# Patient Record
Sex: Female | Born: 1975
Health system: Southern US, Community
[De-identification: ages and names within clinical notes are randomized; demographics above are authoritative.]

## PROBLEM LIST (undated history)

## (undated) DIAGNOSIS — F4321 Adjustment disorder with depressed mood: Secondary | ICD-10-CM

## (undated) DIAGNOSIS — R519 Headache, unspecified: Secondary | ICD-10-CM

## (undated) DIAGNOSIS — R51 Headache: Secondary | ICD-10-CM

## (undated) DIAGNOSIS — E039 Hypothyroidism, unspecified: Secondary | ICD-10-CM

## (undated) DIAGNOSIS — D649 Anemia, unspecified: Secondary | ICD-10-CM

## (undated) DIAGNOSIS — E119 Type 2 diabetes mellitus without complications: Secondary | ICD-10-CM

## (undated) DIAGNOSIS — G5700 Lesion of sciatic nerve, unspecified lower limb: Secondary | ICD-10-CM

## (undated) DIAGNOSIS — F419 Anxiety disorder, unspecified: Secondary | ICD-10-CM

## (undated) DIAGNOSIS — E559 Vitamin D deficiency, unspecified: Secondary | ICD-10-CM

## (undated) DIAGNOSIS — E539 Vitamin B deficiency, unspecified: Secondary | ICD-10-CM

## (undated) DIAGNOSIS — K219 Gastro-esophageal reflux disease without esophagitis: Secondary | ICD-10-CM

## (undated) DIAGNOSIS — G4700 Insomnia, unspecified: Secondary | ICD-10-CM

## (undated) HISTORY — DX: Vitamin D deficiency, unspecified: E55.9

## (undated) HISTORY — PX: TOTAL ABDOMINAL HYSTERECTOMY: SHX209

## (undated) HISTORY — DX: Headache: R51

## (undated) HISTORY — DX: Anemia, unspecified: D64.9

## (undated) HISTORY — DX: Adjustment disorder with depressed mood: F43.21

## (undated) HISTORY — DX: Type 2 diabetes mellitus without complications: E11.9

## (undated) HISTORY — DX: Lesion of sciatic nerve, unspecified lower limb: G57.00

## (undated) HISTORY — DX: Headache, unspecified: R51.9

## (undated) HISTORY — DX: Vitamin B deficiency, unspecified: E53.9

## (undated) HISTORY — DX: Insomnia, unspecified: G47.00

## (undated) HISTORY — DX: Anxiety disorder, unspecified: F41.9

---

## 1997-11-09 ENCOUNTER — Other Ambulatory Visit: Admission: RE | Admit: 1997-11-09 | Discharge: 1997-11-09 | Payer: Self-pay | Admitting: Obstetrics & Gynecology

## 1999-12-11 ENCOUNTER — Other Ambulatory Visit: Admission: RE | Admit: 1999-12-11 | Discharge: 1999-12-11 | Payer: Self-pay | Admitting: Obstetrics & Gynecology

## 2000-11-25 ENCOUNTER — Other Ambulatory Visit: Admission: RE | Admit: 2000-11-25 | Discharge: 2000-11-25 | Payer: Self-pay | Admitting: Obstetrics and Gynecology

## 2002-01-27 ENCOUNTER — Other Ambulatory Visit: Admission: RE | Admit: 2002-01-27 | Discharge: 2002-01-27 | Payer: Self-pay | Admitting: Obstetrics and Gynecology

## 2002-08-09 ENCOUNTER — Encounter: Payer: Self-pay | Admitting: *Deleted

## 2002-08-09 ENCOUNTER — Ambulatory Visit (HOSPITAL_COMMUNITY): Admission: RE | Admit: 2002-08-09 | Discharge: 2002-08-09 | Payer: Self-pay | Admitting: *Deleted

## 2003-01-10 ENCOUNTER — Encounter: Payer: Self-pay | Admitting: Gastroenterology

## 2003-01-10 ENCOUNTER — Encounter: Admission: RE | Admit: 2003-01-10 | Discharge: 2003-01-10 | Payer: Self-pay | Admitting: Gastroenterology

## 2003-04-14 ENCOUNTER — Other Ambulatory Visit: Admission: RE | Admit: 2003-04-14 | Discharge: 2003-04-14 | Payer: Self-pay | Admitting: Obstetrics and Gynecology

## 2003-04-19 ENCOUNTER — Encounter: Payer: Self-pay | Admitting: Obstetrics and Gynecology

## 2003-04-19 ENCOUNTER — Ambulatory Visit (HOSPITAL_COMMUNITY): Admission: RE | Admit: 2003-04-19 | Discharge: 2003-04-19 | Payer: Self-pay | Admitting: Obstetrics and Gynecology

## 2003-11-28 ENCOUNTER — Ambulatory Visit (HOSPITAL_COMMUNITY): Admission: RE | Admit: 2003-11-28 | Discharge: 2003-11-28 | Payer: Self-pay | Admitting: Gastroenterology

## 2004-05-31 ENCOUNTER — Encounter: Admission: RE | Admit: 2004-05-31 | Discharge: 2004-05-31 | Payer: Self-pay | Admitting: Gastroenterology

## 2004-09-14 ENCOUNTER — Emergency Department (HOSPITAL_COMMUNITY): Admission: EM | Admit: 2004-09-14 | Discharge: 2004-09-14 | Payer: Self-pay | Admitting: Emergency Medicine

## 2005-01-17 ENCOUNTER — Other Ambulatory Visit: Admission: RE | Admit: 2005-01-17 | Discharge: 2005-01-17 | Payer: Self-pay | Admitting: Obstetrics and Gynecology
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
## 2006-02-24 ENCOUNTER — Other Ambulatory Visit: Admission: RE | Admit: 2006-02-24 | Discharge: 2006-02-24 | Payer: Self-pay | Admitting: Obstetrics and Gynecology

## 2006-12-01 ENCOUNTER — Other Ambulatory Visit: Admission: RE | Admit: 2006-12-01 | Discharge: 2006-12-01 | Payer: Self-pay | Admitting: Obstetrics & Gynecology

## 2007-04-29 ENCOUNTER — Other Ambulatory Visit: Admission: RE | Admit: 2007-04-29 | Discharge: 2007-04-29 | Payer: Self-pay | Admitting: Obstetrics and Gynecology

## 2008-05-29 ENCOUNTER — Other Ambulatory Visit: Admission: RE | Admit: 2008-05-29 | Discharge: 2008-05-29 | Payer: Self-pay | Admitting: Obstetrics and Gynecology

## 2010-02-10 ENCOUNTER — Emergency Department (HOSPITAL_COMMUNITY): Admission: EM | Admit: 2010-02-10 | Discharge: 2010-02-10 | Payer: Self-pay | Admitting: Emergency Medicine

## 2010-02-12 ENCOUNTER — Encounter: Admission: RE | Admit: 2010-02-12 | Discharge: 2010-02-12 | Payer: Self-pay | Admitting: *Deleted

## 2010-08-05 ENCOUNTER — Encounter
Admission: RE | Admit: 2010-08-05 | Discharge: 2010-08-05 | Payer: Self-pay | Source: Home / Self Care | Attending: Obstetrics and Gynecology | Admitting: Obstetrics and Gynecology

## 2011-04-07 ENCOUNTER — Ambulatory Visit (INDEPENDENT_AMBULATORY_CARE_PROVIDER_SITE_OTHER): Payer: 59 | Admitting: Family Medicine

## 2011-04-07 ENCOUNTER — Encounter: Payer: Self-pay | Admitting: Family Medicine

## 2011-04-07 VITALS — BP 118/70 | HR 64 | Wt 192.0 lb

## 2011-04-07 DIAGNOSIS — N760 Acute vaginitis: Secondary | ICD-10-CM

## 2011-04-07 NOTE — Patient Instructions (Signed)
Followup with your gynecologist with your gynecologist.

## 2011-04-07 NOTE — Progress Notes (Signed)
  Subjective:    Patient ID: Kimberly Atkinson, female    DOB: [birth year], 35 y.o.   MRN: [id number]  HPI She has a 4 year history of intermittent vaginal itching, discharge and odor. She has seen at least 2 physicians concerning this over the last several years. She has tried antifungal medicines as well as metronidazole. She was recently seen by her gynecologist and given a new medication and states she is again having symptoms after finishing the medicine approximately one week ago.   Review of Systems     Objective:   Physical Exam Alert and in no distress. Vaginal exam shows the tissue did appear normal. There was a slight discharge however KOH and wet prep were negative except for some bacteria to       Assessment & Plan:  Vaginitis I discussed the causes of this in regard to other medical conditions that might contribute to it. We discussed diet, other medical conditions including diabetes. Encouraged her to followup with the gynecologist and turn here for other medical concerns.

## 2011-08-13 ENCOUNTER — Encounter (HOSPITAL_COMMUNITY): Payer: Self-pay

## 2011-08-18 MED ORDER — ONDANSETRON HCL 4 MG/2ML IJ SOLN
INTRAMUSCULAR | Status: AC
  Filled 2011-08-18: qty 2

## 2011-08-18 MED ORDER — EPINEPHRINE HCL 0.1 MG/ML IJ SOLN
INTRAMUSCULAR | Status: AC
  Filled 2011-08-18: qty 10

## 2011-08-18 MED ORDER — EPHEDRINE 5 MG/ML INJ
INTRAVENOUS | Status: AC
  Filled 2011-08-18: qty 10

## 2011-08-18 MED ORDER — GLYCOPYRROLATE 0.2 MG/ML IJ SOLN
INTRAMUSCULAR | Status: AC
  Filled 2011-08-18: qty 1

## 2011-08-18 MED ORDER — FENTANYL CITRATE 0.05 MG/ML IJ SOLN
INTRAMUSCULAR | Status: AC
  Filled 2011-08-18: qty 2

## 2011-08-18 MED ORDER — PHENYLEPHRINE 40 MCG/ML (10ML) SYRINGE FOR IV PUSH (FOR BLOOD PRESSURE SUPPORT)
PREFILLED_SYRINGE | INTRAVENOUS | Status: AC
  Filled 2011-08-18: qty 10

## 2011-08-18 MED ORDER — MORPHINE SULFATE 0.5 MG/ML IJ SOLN
INTRAMUSCULAR | Status: AC
  Filled 2011-08-18: qty 10

## 2011-08-18 MED ORDER — OXYTOCIN 10 UNIT/ML IJ SOLN
INTRAMUSCULAR | Status: AC
  Filled 2011-08-18: qty 4

## 2011-08-18 MED ORDER — PHENYLEPHRINE 40 MCG/ML (10ML) SYRINGE FOR IV PUSH (FOR BLOOD PRESSURE SUPPORT)
PREFILLED_SYRINGE | INTRAVENOUS | Status: AC
  Filled 2011-08-18: qty 5

## 2011-08-18 MED ORDER — DIPHENHYDRAMINE HCL 50 MG/ML IJ SOLN
INTRAMUSCULAR | Status: AC
  Filled 2011-08-18: qty 1

## 2011-08-19 ENCOUNTER — Encounter (HOSPITAL_COMMUNITY)
Admission: RE | Admit: 2011-08-19 | Discharge: 2011-08-19 | Disposition: A | Discharge status: Home / Self Care | Payer: 59 | Source: Ambulatory Visit | Attending: Obstetrics and Gynecology | Admitting: Obstetrics and Gynecology

## 2011-08-19 ENCOUNTER — Encounter (HOSPITAL_COMMUNITY): Payer: Self-pay

## 2011-08-19 ENCOUNTER — Other Ambulatory Visit: Payer: Self-pay | Admitting: Obstetrics and Gynecology

## 2011-08-19 HISTORY — DX: Hypothyroidism, unspecified: E03.9

## 2011-08-19 HISTORY — DX: Gastro-esophageal reflux disease without esophagitis: K21.9

## 2011-08-19 LAB — CBC
HCT: 35.9 % — ABNORMAL LOW (ref 36.0–46.0)
MCH: 25.4 pg — ABNORMAL LOW (ref 26.0–34.0)
RBC: 4.45 MIL/uL (ref 3.87–5.11)
RDW: 13.5 % (ref 11.5–15.5)
WBC: 7.9 10*3/uL (ref 4.0–10.5)

## 2011-08-19 LAB — SURGICAL PCR SCREEN: MRSA, PCR: NEGATIVE

## 2011-08-19 NOTE — Patient Instructions (Addendum)
21 Kimberly Atkinson  08/19/2011   Your procedure is scheduled on:  08/28/11  Enter through the Main Entrance of [facility] at 6 AM.  Pick up the phone at the desk and dial [phone].   Call this number if you have problems the morning of surgery: [phone]   Remember:   Do not eat food:After Midnight.  Do not drink clear liquids: After Midnight.  Take these medicines the morning of surgery with A SIP OF WATER: Synthroid as early as possible day of surg with small amount of water.   Do not wear jewelry, make-up or nail polish.  Do not wear lotions, powders, or perfumes. You may wear deodorant.  Do not shave 48 hours prior to surgery.  Do not bring valuables to the hospital.  Contacts, dentures or bridgework may not be worn into surgery.  Leave suitcase in the car. After surgery it may be brought to your room.  For patients admitted to the hospital, checkout time is 11:00 AM the day of discharge.   Patients discharged the day of surgery will not be allowed to drive home.  Name and phone number of your driver: mother   [name]  Special Instructions: CHG Shower Use Special Wash: 1/2 bottle night before surgery and 1/2 bottle morning of surgery.   Please read over the following fact sheets that you were given: MRSA Information

## 2011-08-28 ENCOUNTER — Encounter (HOSPITAL_COMMUNITY): Payer: Self-pay | Admitting: Anesthesiology

## 2011-08-28 ENCOUNTER — Other Ambulatory Visit: Payer: Self-pay | Admitting: Obstetrics and Gynecology

## 2011-08-28 ENCOUNTER — Ambulatory Visit (HOSPITAL_COMMUNITY): Payer: 59 | Admitting: Anesthesiology

## 2011-08-28 ENCOUNTER — Ambulatory Visit (HOSPITAL_COMMUNITY)
Admission: RE | Admit: 2011-08-28 | Discharge: 2011-08-28 | Disposition: A | Discharge status: Home / Self Care | Payer: 59 | Source: Ambulatory Visit | Attending: Obstetrics and Gynecology | Admitting: Obstetrics and Gynecology

## 2011-08-28 ENCOUNTER — Encounter (HOSPITAL_COMMUNITY): Payer: Self-pay | Admitting: *Deleted

## 2011-08-28 ENCOUNTER — Encounter (HOSPITAL_COMMUNITY)
Admission: RE | Disposition: A | Discharge status: Home / Self Care | Payer: Self-pay | Source: Ambulatory Visit | Attending: Obstetrics and Gynecology

## 2011-08-28 DIAGNOSIS — N803 Endometriosis of pelvic peritoneum, unspecified: Secondary | ICD-10-CM | POA: Insufficient documentation

## 2011-08-28 DIAGNOSIS — N838 Other noninflammatory disorders of ovary, fallopian tube and broad ligament: Secondary | ICD-10-CM

## 2011-08-28 DIAGNOSIS — Z01812 Encounter for preprocedural laboratory examination: Secondary | ICD-10-CM | POA: Insufficient documentation

## 2011-08-28 DIAGNOSIS — Z01818 Encounter for other preprocedural examination: Secondary | ICD-10-CM | POA: Insufficient documentation

## 2011-08-28 DIAGNOSIS — N840 Polyp of corpus uteri: Secondary | ICD-10-CM | POA: Insufficient documentation

## 2011-08-28 HISTORY — PX: LAPAROSCOPY: SHX197

## 2011-08-28 LAB — PREGNANCY, URINE: Preg Test, Ur: NEGATIVE

## 2011-08-28 SURGERY — LAPAROSCOPY OPERATIVE
Anesthesia: General | Site: Vagina | Laterality: Right | Wound class: Clean Contaminated

## 2011-08-28 MED ORDER — MIDAZOLAM HCL 5 MG/5ML IJ SOLN
INTRAMUSCULAR | Status: DC | PRN
  Administered 2011-08-28: 2 mg via INTRAVENOUS

## 2011-08-28 MED ORDER — KETOROLAC TROMETHAMINE 30 MG/ML IJ SOLN
INTRAMUSCULAR | Status: DC | PRN
  Administered 2011-08-28: 30 mg via INTRAVENOUS

## 2011-08-28 MED ORDER — MIDAZOLAM HCL 2 MG/2ML IJ SOLN
INTRAMUSCULAR | Status: AC
  Filled 2011-08-28: qty 2

## 2011-08-28 MED ORDER — HYDROCODONE-ACETAMINOPHEN 5-500 MG PO TABS: 1.0000 | ORAL_TABLET | Freq: Four times a day (QID) | ORAL | Status: AC | PRN

## 2011-08-28 MED ORDER — HYDROMORPHONE HCL PF 1 MG/ML IJ SOLN
INTRAMUSCULAR | Status: DC | PRN
  Administered 2011-08-28: 1 mg via INTRAVENOUS

## 2011-08-28 MED ORDER — ROCURONIUM BROMIDE 50 MG/5ML IV SOLN
INTRAVENOUS | Status: AC
  Filled 2011-08-28: qty 1

## 2011-08-28 MED ORDER — PROPOFOL 10 MG/ML IV EMUL
INTRAVENOUS | Status: AC
  Filled 2011-08-28: qty 20

## 2011-08-28 MED ORDER — FENTANYL CITRATE 0.05 MG/ML IJ SOLN
INTRAMUSCULAR | Status: DC | PRN
  Administered 2011-08-28: 50 ug via INTRAVENOUS
  Administered 2011-08-28: 100 ug via INTRAVENOUS
  Administered 2011-08-28 (×2): 50 ug via INTRAVENOUS

## 2011-08-28 MED ORDER — ONDANSETRON HCL 4 MG/2ML IJ SOLN
INTRAMUSCULAR | Status: DC | PRN
  Administered 2011-08-28: 4 mg via INTRAVENOUS

## 2011-08-28 MED ORDER — LACTATED RINGERS IR SOLN
Status: DC | PRN
  Administered 2011-08-28: 3000 mL

## 2011-08-28 MED ORDER — FENTANYL CITRATE 0.05 MG/ML IJ SOLN
INTRAMUSCULAR | Status: AC
  Filled 2011-08-28: qty 5

## 2011-08-28 MED ORDER — KETOROLAC TROMETHAMINE 60 MG/2ML IM SOLN
INTRAMUSCULAR | Status: DC | PRN
  Administered 2011-08-28: 30 mg via INTRAMUSCULAR

## 2011-08-28 MED ORDER — FENTANYL CITRATE 0.05 MG/ML IJ SOLN: 25.0000 ug | INTRAMUSCULAR | Status: DC | PRN

## 2011-08-28 MED ORDER — HYDROMORPHONE HCL PF 1 MG/ML IJ SOLN
INTRAMUSCULAR | Status: AC
  Filled 2011-08-28: qty 1

## 2011-08-28 MED ORDER — GLYCINE 1.5 % IR SOLN
Status: DC | PRN
  Administered 2011-08-28 (×2): 3000 mL

## 2011-08-28 MED ORDER — LIDOCAINE HCL (CARDIAC) 20 MG/ML IV SOLN
INTRAVENOUS | Status: DC | PRN
  Administered 2011-08-28: 60 mg via INTRAVENOUS

## 2011-08-28 MED ORDER — ONDANSETRON HCL 4 MG/2ML IJ SOLN
INTRAMUSCULAR | Status: AC
  Filled 2011-08-28: qty 2

## 2011-08-28 MED ORDER — LACTATED RINGERS IV SOLN
INTRAVENOUS | Status: DC
  Administered 2011-08-28 (×3): via INTRAVENOUS

## 2011-08-28 MED ORDER — INDIGOTINDISULFONATE SODIUM 8 MG/ML IJ SOLN
INTRAMUSCULAR | Status: AC
  Filled 2011-08-28: qty 5

## 2011-08-28 MED ORDER — NEOSTIGMINE METHYLSULFATE 1 MG/ML IJ SOLN
INTRAMUSCULAR | Status: DC | PRN
  Administered 2011-08-28: 3 mg via INTRAVENOUS

## 2011-08-28 MED ORDER — SODIUM CHLORIDE 0.9 % IJ SOLN
INTRAMUSCULAR | Status: DC | PRN
  Administered 2011-08-28: 10 mL via INTRAVENOUS

## 2011-08-28 MED ORDER — DEXAMETHASONE SODIUM PHOSPHATE 10 MG/ML IJ SOLN
INTRAMUSCULAR | Status: DC | PRN
  Administered 2011-08-28: 10 mg via INTRAVENOUS

## 2011-08-28 MED ORDER — ROCURONIUM BROMIDE 100 MG/10ML IV SOLN
INTRAVENOUS | Status: DC | PRN
  Administered 2011-08-28: 15 mg via INTRAVENOUS
  Administered 2011-08-28: 5 mg via INTRAVENOUS
  Administered 2011-08-28: 30 mg via INTRAVENOUS

## 2011-08-28 MED ORDER — PROPOFOL 10 MG/ML IV EMUL
INTRAVENOUS | Status: DC | PRN
  Administered 2011-08-28: 200 mg via INTRAVENOUS

## 2011-08-28 MED ORDER — KETOROLAC TROMETHAMINE 60 MG/2ML IM SOLN
INTRAMUSCULAR | Status: AC
  Filled 2011-08-28: qty 2

## 2011-08-28 MED ORDER — INDIGOTINDISULFONATE SODIUM 8 MG/ML IJ SOLN
INTRAMUSCULAR | Status: DC | PRN
  Administered 2011-08-28: 5 mL via INTRAVENOUS

## 2011-08-28 MED ORDER — BUPIVACAINE HCL (PF) 0.25 % IJ SOLN
INTRAMUSCULAR | Status: DC | PRN
  Administered 2011-08-28: 5 mL

## 2011-08-28 MED ORDER — METOCLOPRAMIDE HCL 5 MG/ML IJ SOLN
INTRAMUSCULAR | Status: AC
  Administered 2011-08-28: 10 mg via INTRAVENOUS
  Filled 2011-08-28: qty 2

## 2011-08-28 MED ORDER — GLYCOPYRROLATE 0.2 MG/ML IJ SOLN
INTRAMUSCULAR | Status: AC
  Filled 2011-08-28: qty 2

## 2011-08-28 MED ORDER — NEOSTIGMINE METHYLSULFATE 1 MG/ML IJ SOLN
INTRAMUSCULAR | Status: AC
  Filled 2011-08-28: qty 10

## 2011-08-28 MED ORDER — LIDOCAINE HCL (CARDIAC) 20 MG/ML IV SOLN
INTRAVENOUS | Status: AC
  Filled 2011-08-28: qty 5

## 2011-08-28 MED ORDER — DEXAMETHASONE SODIUM PHOSPHATE 10 MG/ML IJ SOLN
INTRAMUSCULAR | Status: AC
  Filled 2011-08-28: qty 1

## 2011-08-28 MED ORDER — GLYCOPYRROLATE 0.2 MG/ML IJ SOLN
INTRAMUSCULAR | Status: DC | PRN
  Administered 2011-08-28: 0.1 mg via INTRAVENOUS
  Administered 2011-08-28: .6 mg via INTRAVENOUS

## 2011-08-28 MED ORDER — METOCLOPRAMIDE HCL 5 MG/ML IJ SOLN
10.0000 mg | Freq: Once | INTRAMUSCULAR | Status: AC | PRN
  Administered 2011-08-28: 10 mg via INTRAVENOUS

## 2011-08-28 SURGICAL SUPPLY — 38 items
APPLICATOR COTTON TIP 6IN STRL (MISCELLANEOUS) ×3 IMPLANT
CABLE HIGH FREQUENCY MONO STRZ (ELECTRODE) IMPLANT
CANISTER SUCTION 2500CC (MISCELLANEOUS) ×3 IMPLANT
CATH ROBINSON RED A/P 16FR (CATHETERS) ×3 IMPLANT
CLOTH BEACON ORANGE TIMEOUT ST (SAFETY) ×3 IMPLANT
CONTAINER PREFILL 10% NBF 60ML (FORM) ×6 IMPLANT
DERMABOND ADVANCED (GAUZE/BANDAGES/DRESSINGS) ×1
DERMABOND ADVANCED .7 DNX12 (GAUZE/BANDAGES/DRESSINGS) ×2 IMPLANT
ELECT LIGASURE LONG (ELECTRODE) IMPLANT
ELECT REM PT RETURN 9FT ADLT (ELECTROSURGICAL) ×3
ELECTRODE REM PT RTRN 9FT ADLT (ELECTROSURGICAL) ×2 IMPLANT
ELECTRODE ROLLER VERSAPOINT (ELECTRODE) IMPLANT
ELECTRODE RT ANGLE VERSAPOINT (CUTTING LOOP) IMPLANT
EVACUATOR SMOKE 8.L (FILTER) ×3 IMPLANT
FORCEPS CUTTING 33CM 5MM (CUTTING FORCEPS) IMPLANT
FORCEPS CUTTING 45CM 5MM (CUTTING FORCEPS) IMPLANT
GLOVE BIO SURGEON STRL SZ 6.5 (GLOVE) ×3 IMPLANT
GLOVE BIOGEL PI IND STRL 7.0 (GLOVE) ×4 IMPLANT
GLOVE BIOGEL PI INDICATOR 7.0 (GLOVE) ×2
GOWN PREVENTION PLUS LG XLONG (DISPOSABLE) ×9 IMPLANT
GOWN STRL REIN XL XLG (GOWN DISPOSABLE) ×3 IMPLANT
LOOP ANGLED CUTTING 22FR (CUTTING LOOP) IMPLANT
NEEDLE INSUFFLATION 14GA 120MM (NEEDLE) ×3 IMPLANT
NS IRRIG 1000ML POUR BTL (IV SOLUTION) ×3 IMPLANT
PACK HYSTEROSCOPY LF (CUSTOM PROCEDURE TRAY) ×3 IMPLANT
PACK LAPAROSCOPY BASIN (CUSTOM PROCEDURE TRAY) ×3 IMPLANT
POUCH SPECIMEN RETRIEVAL 10MM (ENDOMECHANICALS) IMPLANT
SCISSORS LAP 5X35 DISP (ENDOMECHANICALS) IMPLANT
SET IRRIG TUBING LAPAROSCOPIC (IRRIGATION / IRRIGATOR) IMPLANT
SOLUTION ELECTROLUBE (MISCELLANEOUS) IMPLANT
SUT VICRYL 0 UR6 27IN ABS (SUTURE) ×3 IMPLANT
SUT VICRYL 4-0 PS2 18IN ABS (SUTURE) ×3 IMPLANT
TOWEL OR 17X24 6PK STRL BLUE (TOWEL DISPOSABLE) ×6 IMPLANT
TROCAR BALLN 12MMX100 BLUNT (TROCAR) IMPLANT
TROCAR Z-THREAD BLADED 11X100M (TROCAR) ×3 IMPLANT
TROCAR Z-THREAD BLADED 5X100MM (TROCAR) ×6 IMPLANT
WARMER LAPAROSCOPE (MISCELLANEOUS) ×3 IMPLANT
WATER STERILE IRR 1000ML POUR (IV SOLUTION) ×3 IMPLANT

## 2011-08-28 NOTE — Anesthesia Procedure Notes (Signed)
Procedure Name: Intubation Date/Time: 08/28/2011 7:27 AM Performed by: Karleen Dolphin Pre-anesthesia Checklist: Suction available, Emergency Drugs available, Timeout performed, Patient identified and Patient being monitored Patient Re-evaluated:Patient Re-evaluated prior to inductionOxygen Delivery Method: Circle System Utilized Preoxygenation: Pre-oxygenation with 100% oxygen Intubation Type: IV induction Ventilation: Mask ventilation without difficulty Laryngoscope Size: Mac and 3 Grade View: Grade I Tube type: Oral Tube size: 7.0 mm Number of attempts: 1 Airway Equipment and Method: stylet Placement Confirmation: ETT inserted through vocal cords under direct vision,  breath sounds checked- equal and bilateral and positive ETCO2 Secured at: 21 cm Tube secured with: Tape Dental Injury: Teeth and Oropharynx as per pre-operative assessment

## 2011-08-28 NOTE — Anesthesia Preprocedure Evaluation (Signed)
Anesthesia Evaluation  Patient identified by MRN, date of birth, ID band Patient awake    Reviewed: Allergy & Precautions, H&P , Patient's Chart, lab work & pertinent test results, reviewed documented beta blocker date and time   Airway Mallampati: II TM Distance: >3 FB Neck ROM: full    Dental No notable dental hx.    Pulmonary  clear to auscultation  Pulmonary exam normal       Cardiovascular regular Normal    Neuro/Psych    GI/Hepatic Controlled,  Endo/Other    Renal/GU      Musculoskeletal   Abdominal   Peds  Hematology   Anesthesia Other Findings   Reproductive/Obstetrics                           Anesthesia Physical Anesthesia Plan  ASA: II  Anesthesia Plan: General   Post-op Pain Management:    Induction: Intravenous  Airway Management Planned: LMA  Additional Equipment:   Intra-op Plan:   Post-operative Plan:   Informed Consent: I have reviewed the patients History and Physical, chart, labs and discussed the procedure including the risks, benefits and alternatives for the proposed anesthesia with the patient or authorized representative who has indicated his/her understanding and acceptance.   Dental Advisory Given  Plan Discussed with: CRNA and Surgeon  Anesthesia Plan Comments: (  Discussed  general anesthesia, including possible nausea, instrumentation of airway, sore throat,pulmonary aspiration, etc. I asked if the were any outstanding questions, or  concerns before we proceeded. )        Anesthesia Quick Evaluation

## 2011-08-28 NOTE — Anesthesia Postprocedure Evaluation (Signed)
Anesthesia Post Note  Patient: Kimberly Atkinson  Procedure(s) Performed:  LAPAROSCOPY OPERATIVE - Fugeration of Endometriosis, Removal of Left Paratubal Cyst./ Chromopertubation; DILATATION & CURETTAGE/HYSTEROSCOPY WITH RESECTOCOPE  Anesthesia type: GA  Patient location: PACU  Post pain: Pain level controlled  Post assessment: Post-op Vital signs reviewed  Last Vitals:  Filed Vitals:   08/28/11 1030  BP: 106/61  Pulse: 73  Temp:   Resp: 16    Post vital signs: Reviewed  Level of consciousness: sedated  Complications: No apparent anesthesia complications

## 2011-08-28 NOTE — Brief Op Note (Signed)
08/28/2011  9:49 AM  PATIENT:  Kimberly Atkinson  36 y.o. female  PRE-OPERATIVE DIAGNOSIS:  Endometrial Thickening, Possible endometrial polyp, right ovarian cyst  POST-OPERATIVE DIAGNOSIS:  Endometrial Thickening, endometrial polyp, left paratubal cyst, stage 1 pelvic endometriosis, uterine fibroids  PROCEDURE:  Procedure(s): LAPAROSCOPY OPERATIVE, REMOVAL OF LEFT PARATUBAL CYST, FULGURATION OF PELVIC ENDOMETRIOSIS, DILATATION & CURETTAGE/ DIAGNOSTIC HYSTEROSCOPY WITH HYSTEROSCOPIC RESECTION OF ENDOMETRIAL POLYPS, CHROMOPERTUBATION  SURGEON:  Surgeon(s): Marcas Bowsher Cathie Beams, MD  PHYSICIAN ASSISTANT:   ASSISTANTS: TANYA BAILEY,CNM   ANESTHESIA:   general and paracervical block  EBL:  Total I/O In: 1500 [I.V.:1500] Out: 150 [Urine:150]  FINDINGS: 7-8 CM LEFT PARATUBAL CYST, NL OVARIES, POST RIGHT AND LEFT OVARIAN FOSSA PELVIC ENDOMETRIOSIS, NL RIGHT URETER, POST UTERINE ENDOMETRIOSIS, ALLEN MASTERS WINDOW RIGHT OVA FOSSA, NON SPILLAGE OF BOTH FALLOPIAN TUBES ?SIN, NL APPENDIX, NL LIVER EDGE, 4 POST PEDUNCULATED UTERINE FIBROIDS  BLOOD ADMINISTERED:none  DRAINS: none   LOCAL MEDICATIONS USED:  MARCAINE 6CC and OTHER NESICAINE  SPECIMEN:  Source of Specimen:  LEFT PARATUBAL CYST, ENDOMETRIAL CURRETTINGS,ENDOMETRIAL POLYPS  DISPOSITION OF SPECIMEN:  PATHOLOGY  COUNTS:  YES  TOURNIQUET:  * No tourniquets in log *  DICTATION: .Other Dictation: Dictation Number [id number]  PLAN OF CARE: Discharge to home after PACU  PATIENT DISPOSITION:  PACU - hemodynamically stable.   Delay start of Pharmacological VTE agent (>24hrs) due to surgical blood loss or risk of bleeding:  {YES/NO/NOT APPLICABLE:20182

## 2011-08-28 NOTE — Transfer of Care (Signed)
Immediate Anesthesia Transfer of Care Note  Patient: Kimberly Atkinson  Procedure(s) Performed:  LAPAROSCOPY OPERATIVE - Fugeration of Endometriosis, Removal of Left Paratubal Cyst./ Chromopertubation; DILATATION & CURETTAGE/HYSTEROSCOPY WITH RESECTOCOPE  Patient Location: PACU  Anesthesia Type: General  Level of Consciousness: awake, alert  and oriented  Airway & Oxygen Therapy: Patient Spontanous Breathing and Patient connected to nasal cannula oxygen  Post-op Assessment: Report given to PACU RN and Post -op Vital signs reviewed and stable  Post vital signs: Reviewed and stable  Complications: No apparent anesthesia complications

## 2011-08-29 NOTE — Op Note (Signed)
NAME:  Kimberly Atkinson, Kimberly Atkinson              ACCOUNT NO.:  [id number]  MEDICAL RECORD NO.:  [id number]  LOCATION:  [facility]                          FACILITY:  [facility]  PHYSICIAN:  Maxie Better, M.D.DATE OF BIRTH:  [birth year]  DATE OF PROCEDURE:  08/28/2011 DATE OF DISCHARGE:  08/28/2011                              OPERATIVE REPORT   PREOPERATIVE DIAGNOSES:  Persistent large right ovarian cyst, menorrhagia.  PROCEDURES:  Diagnostic laparoscopy, removal of the left large paratubal cyst, fulguration of pelvic endometriosis, diagnostic hysteroscopy, dilation and curettage, resection of endometrial polyps,chromopertubation.  POSTOPERATIVE DIAGNOSES:  Stage I pelvic endometriosis, left paratubal cyst, menorrhagia, endometrial polyps, uterine fibroids.  ANESTHESIA:  General.  SURGEON:  Maxie Better, MD  ASSISTANT:  Marlinda Mike, CNM  PROCEDURE:  Under adequate general anesthesia, the patient was placed in the dorsal lithotomy position.  She was sterilely prepped and draped in the usual fashion.  Bladder was catheterized for moderate amount of urine.  Examination under anesthesia revealed anteverted uterus with a large mass in the posterior cul-de-sac.  A bivalve speculum was placed in the vagina.  A single-tooth tenaculum was placed on the anterior lip of the cervix.  20 mL of 1% Nesacaine was injected paracervically at 3 and 9 o'clock position.  The cervix was serially dilated up to a #25 Pratt dilator.  Diagnostic hysteroscope was introduced into the uterine cavity.  Multiple polypoid lesions were noted.  The tubal ostia was difficult to see.  The hysteroscope was removed.  The cavity was then curetted and the hysteroscope reinserted.  No polypoid lesion noted. The cervix was then dilated up to a #29 Pratt dilator and a resectoscope with a single loop was inserted.  The polypoid lesion was then resected and removed.  The resectoscope was removed.  Cavity was curetted. Insertion of  the resectoscope showed good removal of the endometrial polyps.  The acorn cannula was introduced into the cervical os and attached to the tenaculum for manipulation of the uterus.  Bivalve speculum was removed.  Attention was then turned to the abdomen.  0.25% Marcaine was injected along the planned infraumbilical incision.  The infraumbilical incision was then made.  Veress needle was introduced. Initial placement with normal saline was not successful.  Therefore, the needle was then reinserted, tested.  Carbon dioxide was insufflated.  Opening pressure was 7 noted.  3.5 L of CO2 was insufflated.  Veress needle was then removed.  A 10-mm disposable trocar was introduced into the abdomen without incident.  A lighted video laparoscope was placed through that port.  Upper abdomen was noted via normal liver edge and pelvis.  The patient was placed in Trendelenburg. Uterine manipulation revealed the uterus had multiple subserosal at least 4 on the posterior wall fibroids with normal right tube and normal right ovary with a cystic mass and fill in the posterior cul-de-sac. Two ports in the right and left lower quadrant were then placed with 5 mm sites under direct visualization.  Using the probe, the pelvis was further inspected revealing that this was actually a left large about 7 cm to 8 cm cystic mass arising in the mesosalpinx of the left fallopian tubes.  The left ovary appeared otherwise  normal.  The mass was then stabilized.  The antimesenteric border of the tube and overlying the cyst was then cauterized in a linear fashion.  The cyst wall was then opened.  Incidental rupture copious fluid occurred.  Subsequently, the cyst wall was removed with blunt and sharp dissection.  The defect had small bleeders which was ultimately cauterized.  Using the umbilical site, the cystic wall was removed.  Attention was then turned to the pelvis.  There was evidence of endometriosis in the right  lateral posterior fossa as well as on the left.  Unfortunately, some of the endometriosis was overlying the right ureter which was noted to be peristalsing and was not in a good position for resection of the lesion.  The areas that were could be cauterized were cauterized/fulgurated.  The appendix appeared normal.  Due to the fact that the mass was under the left tube, decision was then made to perform chromopertubation through the acorn cannula.  Despite use of 240 mL of fluid, the suggestion of filling of the tube on the left more so on the right was noted, but no spillage noted on either tube.  That procedure was terminated.  The abdomen was copiously irrigated and suctioned with good hemostasis noted.  The lower ports were then removed.  The abdomen was deflated and the infraumbilical site was removed under direct visualization taking care not to bring up any underlying structures. The instruments from the vagina were also removed.  Specimen was the left paratubal cyst wall, endometrial curettings, endometrial polyps sent to Pathology.  Estimated blood loss was about 30 mL. Intraoperative fluid 1400 mL.  Urine output quantity sufficient.  Sponge and instrument counts was correct.  Complication was none.  The patient tolerated the procedure well and was transferred to the recovery room in stable condition.     Maxie Better, M.D.     [place]/MEDQ  D:  08/29/2011  T:  08/29/2011  Job:  [id number]

## 2011-09-01 ENCOUNTER — Encounter (HOSPITAL_COMMUNITY): Payer: Self-pay | Admitting: Obstetrics and Gynecology

## 2011-10-24 ENCOUNTER — Encounter (INDEPENDENT_AMBULATORY_CARE_PROVIDER_SITE_OTHER): Payer: Self-pay

## 2011-11-18 ENCOUNTER — Encounter (INDEPENDENT_AMBULATORY_CARE_PROVIDER_SITE_OTHER): Payer: Self-pay | Admitting: General Surgery

## 2011-11-18 ENCOUNTER — Ambulatory Visit (INDEPENDENT_AMBULATORY_CARE_PROVIDER_SITE_OTHER): Payer: 59 | Admitting: General Surgery

## 2011-11-18 VITALS — BP 124/82 | Ht 67.5 in | Wt 205.0 lb

## 2011-11-18 DIAGNOSIS — L732 Hidradenitis suppurativa: Secondary | ICD-10-CM

## 2011-11-18 NOTE — Progress Notes (Signed)
Patient ID: Kimberly Atkinson, female   DOB: 1976/01/12, 36 y.o.   MRN: [id number]  No chief complaint on file.   HPI Kimberly Atkinson is a 36 y.o. female.  Recurrent right axillary infection   First episode about one year ago.  Most recent episode about one month ago, spontaneously drained, no antibiotics. HPI  Past Medical History  Diagnosis Date  . Hypothyroidism   . GERD (gastroesophageal reflux disease)     no meds at present    Past Surgical History  Procedure Date  . No past surgeries   . Laparoscopy 08/28/2011    Procedure: LAPAROSCOPY OPERATIVE;  Surgeon: Serita Kyle, MD;  Location: [facility];  Service: Gynecology;  Laterality: Right;  Fugeration of Endometriosis, Removal of Left Paratubal Cyst./ Chromopertubation    No family history on file.  Social History History  Substance Use Topics  . Smoking status: Never Smoker   . Smokeless tobacco: Never Used  . Alcohol Use: No    No Known Allergies  Current Outpatient Prescriptions  Medication Sig Dispense Refill  . ibuprofen (ADVIL,MOTRIN) 800 MG tablet Take 800 mg by mouth every 8 (eight) hours as needed. menstrual cramps      . levothyroxine (SYNTHROID, LEVOTHROID) 112 MCG tablet Take 112 mcg by mouth daily. Takes 112 mcg every day and and 1/2 tablet ( ) more on Sunday.      . Multiple Vitamins-Minerals (MULTIVITAMINS THER. W/MINERALS) TABS Take 1 tablet by mouth daily.        Review of Systems Review of Systems  Constitutional: Negative.   HENT: Negative.   Eyes: Negative.   Respiratory: Negative.   Cardiovascular: Negative.   Gastrointestinal: Negative.   Genitourinary: Negative.   Musculoskeletal: Negative.   Skin: Positive for wound (induration and chronic scarring from prior infections).  Hematological: Negative.   Psychiatric/Behavioral: Negative.     Blood pressure 124/82, height 5' 7.5" (1.715 m), weight 205 lb (92.987 kg).  Physical Exam Physical Exam  Vitals reviewed. Constitutional:  She is oriented to person, place, and time. She appears well-developed and well-nourished.  HENT:  Head: Normocephalic and atraumatic.  Eyes: Conjunctivae and EOM are normal. Pupils are equal, round, and reactive to light.  Neck: Neck supple.  Cardiovascular: Normal rate, regular rhythm and normal heart sounds.   Pulmonary/Chest: Effort normal and breath sounds normal.  Abdominal: Soft. Bowel sounds are normal.  Musculoskeletal: Normal range of motion.  Neurological: She is alert and oriented to person, place, and time. She has normal reflexes.  Skin: Skin is warm and dry. Lesion (right axillary induration and scarring) noted.  Psychiatric: She has a normal mood and affect. Her behavior is normal. Judgment and thought content normal.    Data Reviewed None  Assessment    Recurrent axillary infection.  Chronic hidradenitis.    Plan    Wide excision of right axillary hidradenitis. Preoperative antibiotics.       Kord Monette III,Sudeep Scheibel O 11/18/2011, 9:04 AM

## 2011-11-24 ENCOUNTER — Other Ambulatory Visit (HOSPITAL_COMMUNITY): Payer: Self-pay | Admitting: Obstetrics and Gynecology

## 2011-11-24 DIAGNOSIS — N809 Endometriosis, unspecified: Secondary | ICD-10-CM

## 2011-12-01 ENCOUNTER — Ambulatory Visit (HOSPITAL_COMMUNITY)
Admission: RE | Admit: 2011-12-01 | Discharge: 2011-12-01 | Disposition: A | Discharge status: Home / Self Care | Payer: 59 | Source: Ambulatory Visit | Attending: Obstetrics and Gynecology | Admitting: Obstetrics and Gynecology

## 2011-12-01 DIAGNOSIS — N809 Endometriosis, unspecified: Secondary | ICD-10-CM

## 2011-12-01 DIAGNOSIS — N803 Endometriosis of pelvic peritoneum, unspecified: Secondary | ICD-10-CM | POA: Insufficient documentation

## 2011-12-01 DIAGNOSIS — D259 Leiomyoma of uterus, unspecified: Secondary | ICD-10-CM | POA: Insufficient documentation

## 2011-12-01 MED ORDER — IOHEXOL 300 MG/ML  SOLN
20.0000 mL | Freq: Once | INTRAMUSCULAR | Status: AC | PRN
  Administered 2011-12-01: 20 mL

## 2011-12-29 ENCOUNTER — Encounter (HOSPITAL_BASED_OUTPATIENT_CLINIC_OR_DEPARTMENT_OTHER): Payer: Self-pay | Admitting: *Deleted

## 2012-01-05 ENCOUNTER — Ambulatory Visit (HOSPITAL_BASED_OUTPATIENT_CLINIC_OR_DEPARTMENT_OTHER): Admission: RE | Admit: 2012-01-05 | Payer: 59 | Source: Ambulatory Visit | Admitting: General Surgery

## 2012-01-05 ENCOUNTER — Encounter (HOSPITAL_BASED_OUTPATIENT_CLINIC_OR_DEPARTMENT_OTHER): Admission: RE | Payer: Self-pay | Source: Ambulatory Visit

## 2012-01-05 SURGERY — EXCISION, HIDRADENITIS, AXILLA
Anesthesia: General | Laterality: Right

## 2012-01-27 ENCOUNTER — Encounter (INDEPENDENT_AMBULATORY_CARE_PROVIDER_SITE_OTHER): Payer: 59 | Admitting: General Surgery

## 2013-05-08 ENCOUNTER — Emergency Department (HOSPITAL_COMMUNITY): Payer: 59

## 2013-05-08 ENCOUNTER — Emergency Department (HOSPITAL_COMMUNITY)
Admission: EM | Admit: 2013-05-08 | Discharge: 2013-05-08 | Disposition: A | Discharge status: Home / Self Care | Payer: 59 | Attending: Emergency Medicine | Admitting: Emergency Medicine

## 2013-05-08 ENCOUNTER — Encounter (HOSPITAL_COMMUNITY): Payer: Self-pay | Admitting: Emergency Medicine

## 2013-05-08 DIAGNOSIS — Z8719 Personal history of other diseases of the digestive system: Secondary | ICD-10-CM | POA: Insufficient documentation

## 2013-05-08 DIAGNOSIS — Z79899 Other long term (current) drug therapy: Secondary | ICD-10-CM | POA: Insufficient documentation

## 2013-05-08 DIAGNOSIS — R51 Headache: Secondary | ICD-10-CM | POA: Insufficient documentation

## 2013-05-08 DIAGNOSIS — E039 Hypothyroidism, unspecified: Secondary | ICD-10-CM | POA: Insufficient documentation

## 2013-05-08 DIAGNOSIS — R209 Unspecified disturbances of skin sensation: Secondary | ICD-10-CM | POA: Insufficient documentation

## 2013-05-08 DIAGNOSIS — R200 Anesthesia of skin: Secondary | ICD-10-CM

## 2013-05-08 LAB — POCT I-STAT, CHEM 8
BUN: 5 mg/dL — ABNORMAL LOW (ref 6–23)
Chloride: 101 mEq/L (ref 96–112)
Creatinine, Ser: 1 mg/dL (ref 0.50–1.10)
Sodium: 139 mEq/L (ref 135–145)

## 2013-05-08 MED ORDER — SUMATRIPTAN SUCCINATE 50 MG PO TABS: 50.0000 mg | ORAL_TABLET | ORAL | Status: DC | PRN

## 2013-05-08 NOTE — ED Notes (Signed)
Sudden onset of numbness on rt face and rt arm about 24 hours ago, numbness continues

## 2013-05-08 NOTE — ED Provider Notes (Signed)
CSN: [id number]     Arrival date & time 05/08/13  1924 History   First MD Initiated Contact with Patient 05/08/13 1949     Chief Complaint  Patient presents with  . Numbness    HPI Comments: Symptoms started with a sharp pain in her head yesterday followed by numbness on the right side of the face.  Patient is a 37 y.o. female presenting with neurologic complaint. The history is provided by the patient.  Neurologic Problem The current episode started yesterday. The problem occurs constantly. The problem has not changed since onset.Associated symptoms include headaches. Pertinent negatives include no chest pain and no shortness of breath. Nothing aggravates the symptoms. Nothing relieves the symptoms. Treatments tried: a little better after lying down.    Past Medical History  Diagnosis Date  . Hypothyroidism   . GERD (gastroesophageal reflux disease)     no meds at present   Past Surgical History  Procedure Laterality Date  . Laparoscopy  08/28/2011    Procedure: LAPAROSCOPY OPERATIVE;  Surgeon: Serita Kyle, MD;  Location: [facility];  Service: Gynecology;  Laterality: Right;  Fugeration of Endometriosis, Removal of Left Paratubal Cyst./ Chromopertubation   No family history on file. History  Substance Use Topics  . Smoking status: Never Smoker   . Smokeless tobacco: Never Used  . Alcohol Use: No   OB History   Grav Para Term Preterm Abortions TAB SAB Ect Mult Living                 Review of Systems  Constitutional: Negative for fever.  Respiratory: Negative for shortness of breath.   Cardiovascular: Negative for chest pain.  Neurological: Positive for numbness and headaches. Negative for tremors, seizures, facial asymmetry, speech difficulty and light-headedness.    Allergies  Review of patient's allergies indicates no known allergies.  Home Medications   Current Outpatient Rx  Name  Route  Sig  Dispense  Refill  . folic acid (FOLVITE) 1 MG tablet   Oral  Take 1 mg by mouth daily.         Marland Kitchen ibuprofen (ADVIL,MOTRIN) 800 MG tablet   Oral   Take 800 mg by mouth every 8 (eight) hours as needed. menstrual cramps         . levothyroxine (SYNTHROID, LEVOTHROID) 112 MCG tablet   Oral   Take 112 mcg by mouth daily. Takes 112 mcg every day and and 1/2 tablet ( ) more on Sunday.         . Multiple Vitamins-Minerals (MULTIVITAMINS THER. W/MINERALS) TABS   Oral   Take 1 tablet by mouth daily.          BP 122/86  Pulse 64  Temp(Src) 98.3 F (36.8 C) (Oral)  Resp 15  Ht 5\' 7"  (1.702 m)  Wt 210 lb (95.255 kg)  BMI 32.88 kg/m2  SpO2 99%  LMP 05/02/2013 Physical Exam  Nursing note and vitals reviewed. Constitutional: She is oriented to person, place, and time. She appears well-developed and well-nourished. No distress.  HENT:  Head: Normocephalic and atraumatic.  Right Ear: External ear normal.  Left Ear: External ear normal.  Mouth/Throat: Oropharynx is clear and moist.  Eyes: Conjunctivae are normal. Right eye exhibits no discharge. Left eye exhibits no discharge. No scleral icterus.  Neck: Neck supple. No tracheal deviation present.  Cardiovascular: Normal rate, regular rhythm and intact distal pulses.   Pulmonary/Chest: Effort normal and breath sounds normal. No stridor. No respiratory distress. She has no wheezes. She  has no rales.  Abdominal: Soft. Bowel sounds are normal. She exhibits no distension. There is no tenderness. There is no rebound and no guarding.  Musculoskeletal: She exhibits no edema and no tenderness.  Neurological: She is alert and oriented to person, place, and time. She has normal strength. No cranial nerve deficit ( no gross defecits noted) or sensory deficit. She exhibits normal muscle tone. She displays no seizure activity. Coordination normal.  No pronator drift bilateral upper extrem, able to hold both legs off bed for 5 seconds, sensation intact in all extremities, no visual field cuts, no left or  right sided neglect  Skin: Skin is warm and dry. No rash noted.  Psychiatric: She has a normal mood and affect.    ED Course  Procedures (including critical care time) Labs Review Labs Reviewed  POCT I-STAT, CHEM 8 - Abnormal; Notable for the following:    BUN 5 (*)    Calcium, Ion 1.26 (*)    Hemoglobin 11.6 (*)    HCT 34.0 (*)    All other components within normal limits   Imaging Review Ct Head Wo Contrast  05/08/2013   CLINICAL DATA:  Right facial and arm numbness.  EXAM: CT HEAD WITHOUT CONTRAST  TECHNIQUE: Contiguous axial images were obtained from the base of the skull through the vertex without intravenous contrast.  COMPARISON:  None.  FINDINGS: No acute intracranial abnormality. Specifically, no hemorrhage, hydrocephalus, mass lesion, acute infarction, or significant intracranial injury. No acute calvarial abnormality. Visualized paranasal sinuses and mastoids clear. Orbital soft tissues unremarkable.  IMPRESSION: Negative.   Electronically Signed   By: Charlett Nose M.D.   On: 05/08/2013 20:17    EKG Interpretation   None       MDM   1. Numbness    The patient's neurologic exam is normal. She has subjective decreased sensation on the right side of her face however she is able to appreciate light touch.  It is possible her symptoms may be related to an atypical migraine. She does not have a headache now but she did have some symptoms earlier.  I doubt stroke or TIA.  At this time there does not appear to be any evidence of an acute emergency medical condition and the patient appears stable for discharge with appropriate outpatient follow up.    Celene Kras, MD 05/08/13 2230

## 2013-07-18 ENCOUNTER — Other Ambulatory Visit: Payer: Self-pay | Admitting: Neurology

## 2013-07-18 DIAGNOSIS — R519 Headache, unspecified: Secondary | ICD-10-CM

## 2013-07-18 DIAGNOSIS — R200 Anesthesia of skin: Secondary | ICD-10-CM

## 2013-08-03 ENCOUNTER — Other Ambulatory Visit: Payer: 59

## 2013-09-19 ENCOUNTER — Encounter: Payer: 59 | Attending: Family Medicine | Admitting: Dietician

## 2013-09-19 ENCOUNTER — Encounter: Payer: Self-pay | Admitting: Dietician

## 2013-09-19 VITALS — Ht 67.5 in | Wt 200.0 lb

## 2013-09-19 DIAGNOSIS — R7309 Other abnormal glucose: Secondary | ICD-10-CM | POA: Insufficient documentation

## 2013-09-19 DIAGNOSIS — Z713 Dietary counseling and surveillance: Secondary | ICD-10-CM | POA: Insufficient documentation

## 2013-09-19 DIAGNOSIS — E669 Obesity, unspecified: Secondary | ICD-10-CM

## 2013-09-19 NOTE — Patient Instructions (Signed)
Increase physical activity: Try to go to the gym after work 2-3 times a week. Other days of the week get on the treadmill or when it's warmer outside, go for a walk. Snacks: Pair a carbohydrate food with a protein (apples with peanut butter, orange with nuts or seeds, crackers with cheese) Continue eating lots of non-starchy vegetables Fill half your plate with vegetables, quarter of the plate with starch and quarter of the plate with protein. Continue drinking tons of water Find ways to reduce stress (exercise, get a massage, speak with a counselor, relax with a cup of tea)

## 2013-09-19 NOTE — Progress Notes (Signed)
  Medical Nutrition Therapy:  Appt start time: 1130 end time:  1230.  Assessment:  Primary concerns today: Alyne is here for nutrition counseling pertaining to pre-diabetes. Scotlyn previously had high cholesterol but is able to control with diet and exercise. She has lost 15 lbs since November. Her latest HgbA1c was around 6.3%. Marvene is interested in learning about eating better and preventing diabetes. She states that she has started gaining weight within the past 8 years, was "very thin" as a child. Noga lives at home by herself so she is responsible for the cooking and grocery shopping. Graciela will cook most of the time by baking and occasionally frying (fish mostly). Sanaz would eat out twice a month, example: [employer], [employer]. She eats her meals in the living room, sometimes in front of the tv and sometimes at her "breakfast bar". She says that she sometimes stress eats at work. She currently works at [employer]. She states that during her meals at home, she is a slow paced eater.   Preferred Learning Style:   No preference indicated   Learning Readiness:  Ready  MEDICATIONS: see list   DIETARY INTAKE:  Avoided foods include candy.    24-hr recall:  B ( AM): instant apple cinnamon oatmeal with nuts, almond milk and smart balance, boiled egg with 2 waffles (no syrup), fruit  Snk ( AM): fruit, peanut butter crackers, yogurt  L ( PM): salad with vegetables, grilled chicken, pasta salad, seeds, cheese, soups, meat with vegetables (brussels sprouts, squash, carrots) Snk ( PM): fruit, peanut butter crackers, yogurt D ( PM): meat with vegetables (broccoli, mixed veg, cauliflower) with grains (brown rice, baked sweet potato, beans) Snk ( PM): popcorn, apple and peanut butter Beverages: water (4-6 bottles), maybe diet soda, crystal light drinks  Usual physical activity: 3x over past month, has a treadmill at home, has a gym at work  Estimated energy needs: 1800 calories 200 g  carbohydrates 135 g protein 50 g fat   Nutritional Diagnosis:  Wright-3.3 Overweight/obesity As related to history of excessive carbohydrate intake and inadequate physical activity.  As evidenced by BMI of 30.9 and HgbA1c of around 6.3%.    Intervention:  Nutrition counseling provided. Goals:  Increase physical activity: Try to go to the gym after work 2-3 times a week. Other days of the week get on the treadmill or when it's warmer outside, go for a walk. Snacks: Pair a carbohydrate food with a protein (apples with peanut butter, orange with nuts or seeds, crackers with cheese) Continue eating lots of non-starchy vegetables Fill half your plate with vegetables, quarter of the plate with starch and quarter of the plate with protein. Continue drinking tons of water Find ways to reduce stress (exercise, get a massage, speak with a counselor, relax with a cup of tea)   Teaching Method Utilized:  Visual Auditory Hands on  Handouts given during visit include: Living Well with Diabetes Blood glucose handouts Meal Plan Card  MyPlate  Barriers to learning/adherence to lifestyle change: stress from work   Demonstrated degree of understanding via:  Teach Back   Monitoring/Evaluation:  Dietary intake, exercise, HgbA1c, and body weight in 2 month(s).

## 2013-09-22 ENCOUNTER — Ambulatory Visit
Admission: RE | Admit: 2013-09-22 | Discharge: 2013-09-22 | Disposition: A | Discharge status: Home / Self Care | Payer: 59 | Source: Ambulatory Visit | Attending: Neurology | Admitting: Neurology

## 2013-09-22 DIAGNOSIS — R200 Anesthesia of skin: Secondary | ICD-10-CM

## 2013-09-22 DIAGNOSIS — R51 Headache: Principal | ICD-10-CM

## 2013-09-22 DIAGNOSIS — R519 Headache, unspecified: Secondary | ICD-10-CM

## 2013-11-23 ENCOUNTER — Ambulatory Visit: Payer: 59 | Admitting: Dietician

## 2013-12-28 ENCOUNTER — Ambulatory Visit: Payer: 59 | Admitting: Dietician

## 2014-01-02 IMAGING — CT CT HEAD W/O CM
2 series · 17 of 30 positions shown, 20 images · non-contrast
Comparison: None.

CLINICAL DATA: Right facial and arm numbness.

EXAM:
CT HEAD WITHOUT CONTRAST
TECHNIQUE: Contiguous axial images were obtained from the base of the skull
through the vertex without intravenous contrast.

[Series 2: head w/o · axial · non-contrast · 0.40mm/px · z∈[-72,+48]mm · 9 of 30 slices shown, 12 images]
[im 3/30  brain]
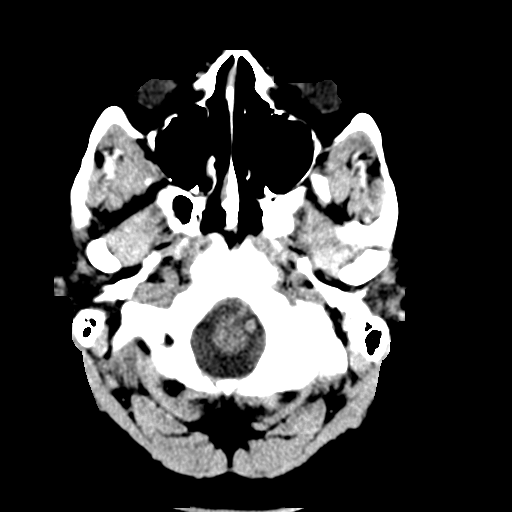
[im 3/30  bone]
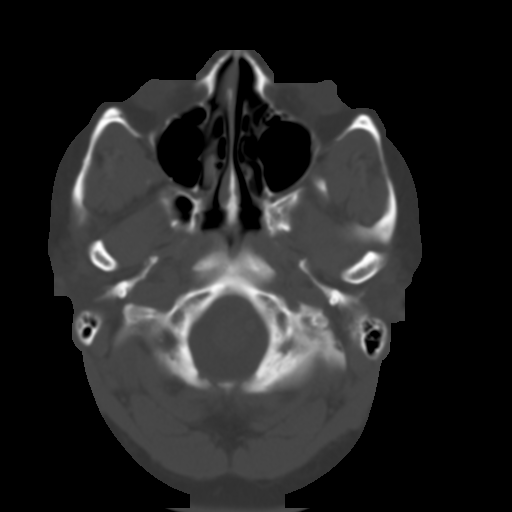
[im 6/30  brain]
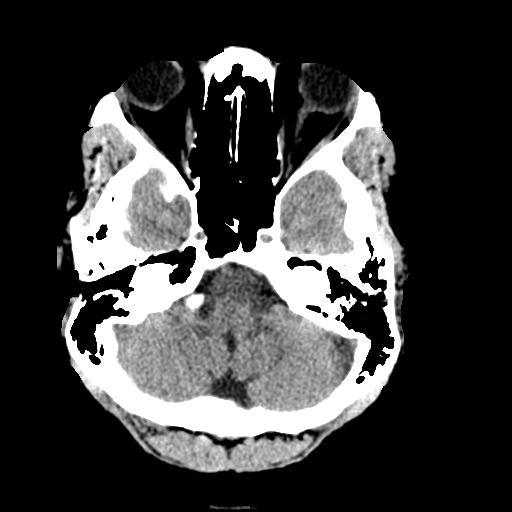
[im 9/30  brain]
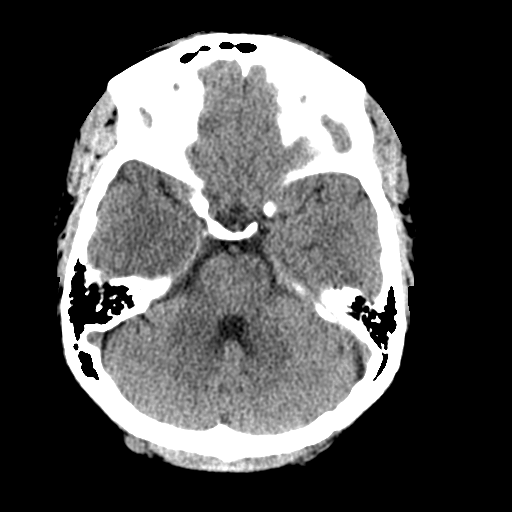
[im 12/30  brain]
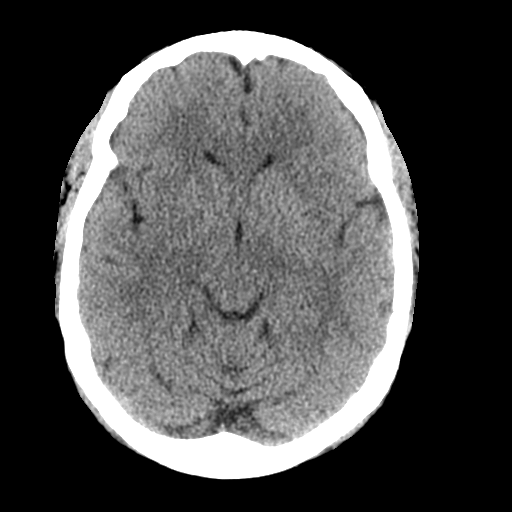
[im 15/30  brain]
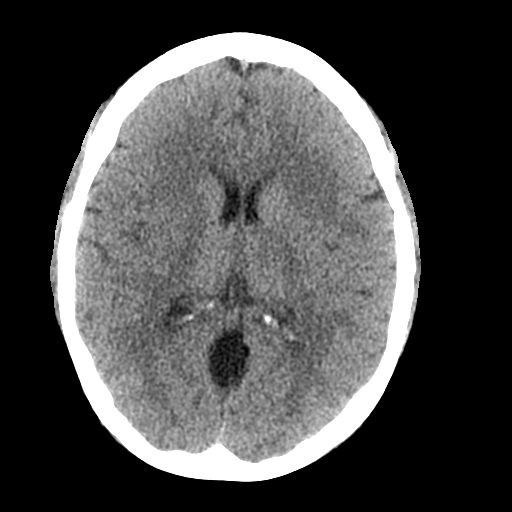
[im 15/30  bone]
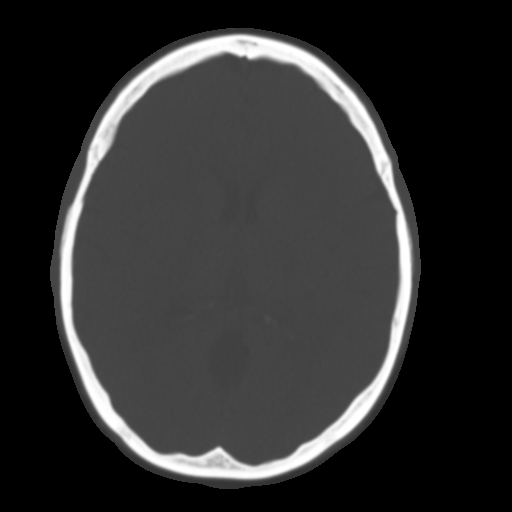
[im 18/30  brain]
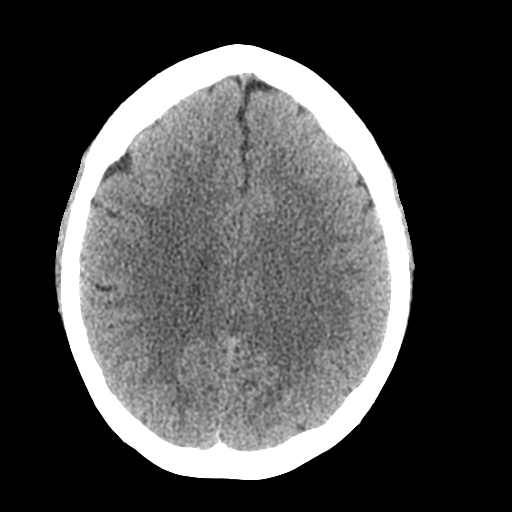
[im 21/30  brain]
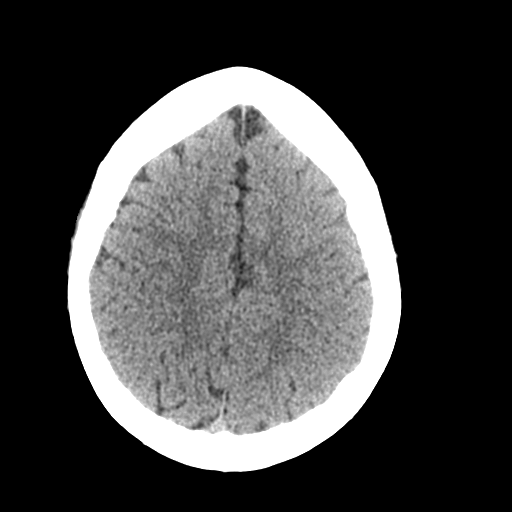
[im 24/30  brain]
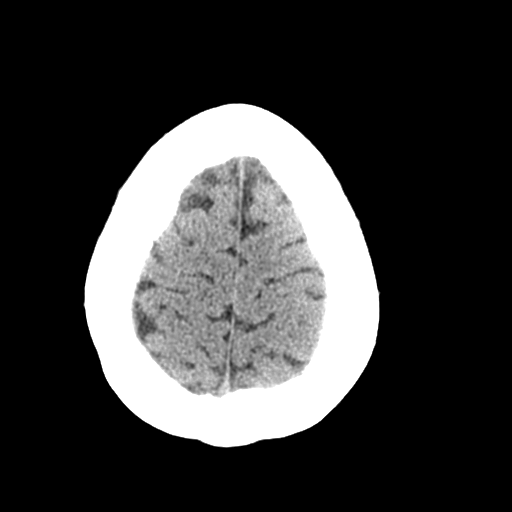
[im 27/30  brain]
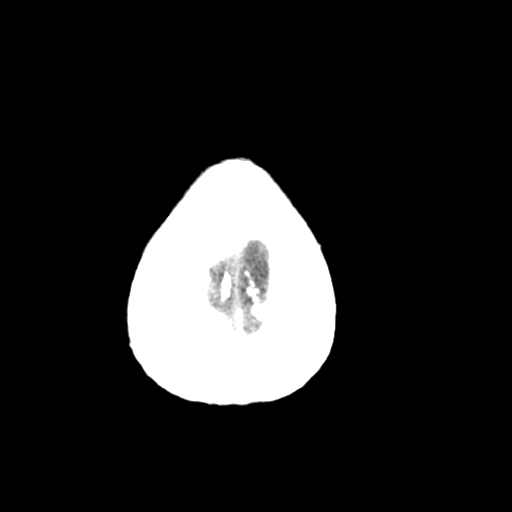
[im 27/30  bone]
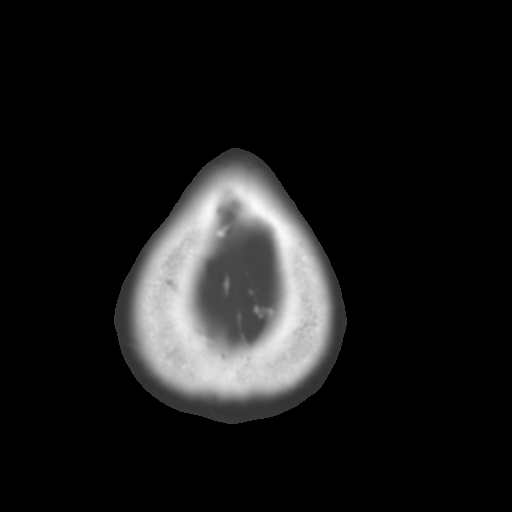

[Series 3: bone windows · axial · 0.40mm/px · z∈[-67,+47]mm · 8 of 50 slices shown]
[im 6/50  bone]
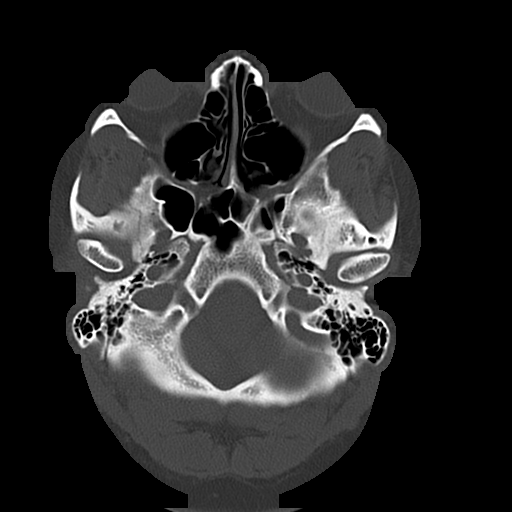
[im 11/50  bone]
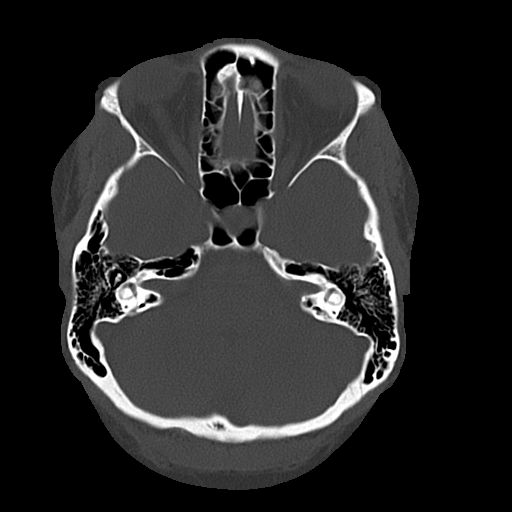
[im 17/50  bone]
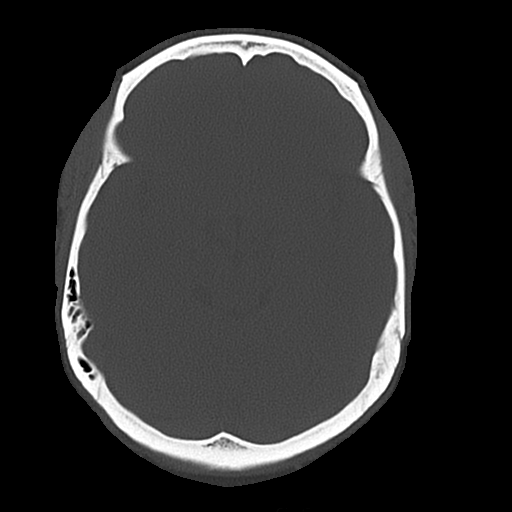
[im 22/50  bone]
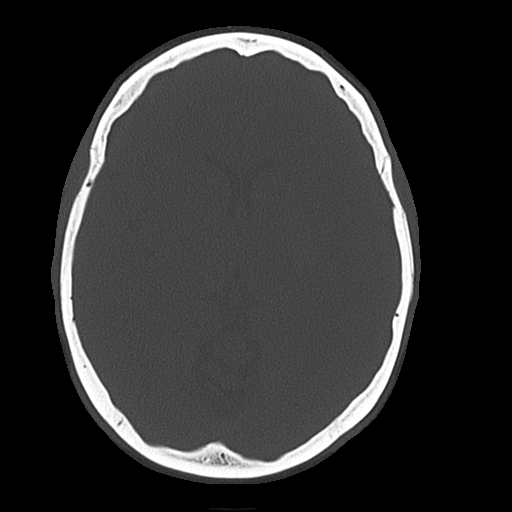
[im 28/50  bone]
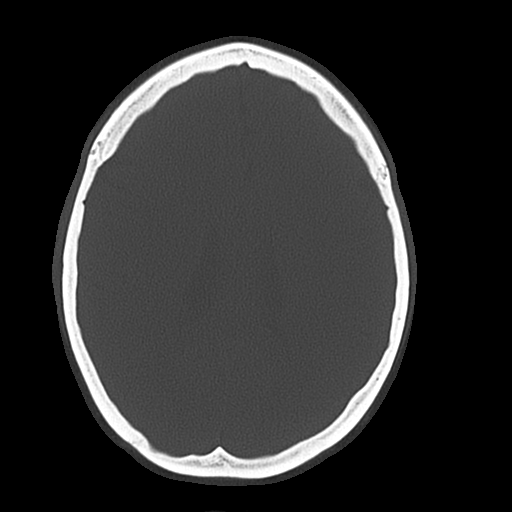
[im 33/50  bone]
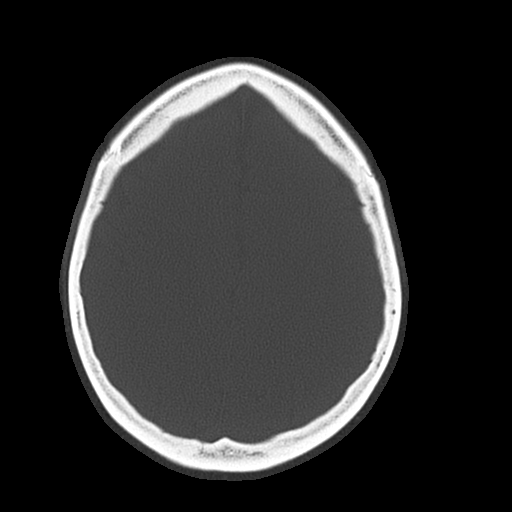
[im 39/50  bone]
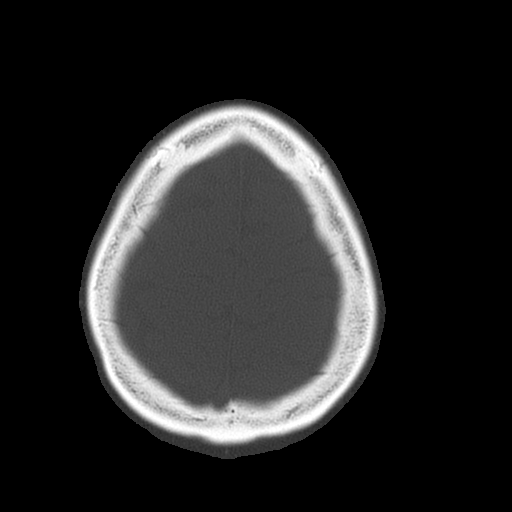
[im 44/50  bone]
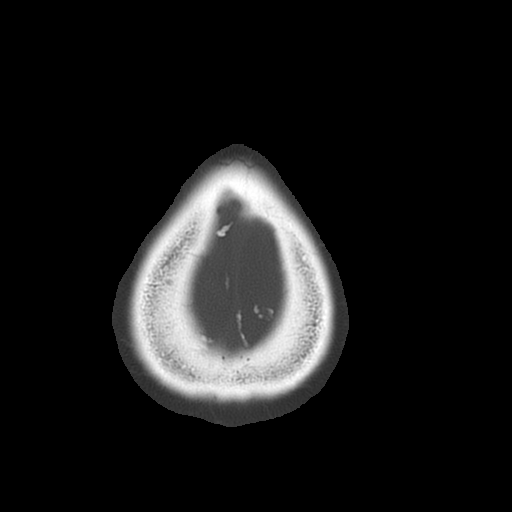

[17 of 30 positions shown; findings below may reference images not displayed]

FINDINGS: No acute intracranial abnormality. Specifically, no hemorrhage,
hydrocephalus, mass lesion, acute infarction, or significant
intracranial injury. No acute calvarial abnormality. Visualized
paranasal sinuses and mastoids clear. Orbital soft tissues
unremarkable.
IMPRESSION: Negative.

## 2014-01-18 ENCOUNTER — Ambulatory Visit (INDEPENDENT_AMBULATORY_CARE_PROVIDER_SITE_OTHER): Payer: 59 | Admitting: General Surgery

## 2014-01-18 ENCOUNTER — Encounter (INDEPENDENT_AMBULATORY_CARE_PROVIDER_SITE_OTHER): Payer: Self-pay | Admitting: General Surgery

## 2014-01-18 VITALS — BP 132/78 | HR 76 | Temp 97.4°F | Ht 67.0 in | Wt 205.0 lb

## 2014-01-18 DIAGNOSIS — L732 Hidradenitis suppurativa: Secondary | ICD-10-CM

## 2014-01-18 MED ORDER — DOXYCYCLINE HYCLATE 100 MG PO TABS: 100.0000 mg | ORAL_TABLET | Freq: Two times a day (BID) | ORAL | Status: DC

## 2014-01-18 NOTE — Patient Instructions (Signed)
Shower daily with antibacterial soap and try to change dressing everyday

## 2014-01-18 NOTE — Progress Notes (Signed)
Patient ID: Kimberly Atkinson, female   DOB: 1975/09/24, 38 y.o.   MRN: [id number]  Chief Complaint  Patient presents with  . hidradenitis    right axilla    HPI Kimberly Atkinson is a 38 y.o. female.  We are asked to see the patient in consultation by Dr. Criss Rosales to evaluate her for a right axillary abscess. The patient is a 38 year old black female who has had problems in the past with boils in her skinfolds. Most recently on Saturday she developed a painful swollen area in her right axilla. It has progressed since Saturday. It started draining about an hour ago. She denies any fevers or chills.  HPI  Past Medical History  Diagnosis Date  . Hypothyroidism   . GERD (gastroesophageal reflux disease)     no meds at present    Past Surgical History  Procedure Laterality Date  . Laparoscopy  08/28/2011    Procedure: LAPAROSCOPY OPERATIVE;  Surgeon: Marvene Staff, MD;  Location: [facility];  Service: Gynecology;  Laterality: Right;  Fugeration of Endometriosis, Removal of Left Paratubal Cyst./ Chromopertubation    History reviewed. No pertinent family history.  Social History History  Substance Use Topics  . Smoking status: Never Smoker   . Smokeless tobacco: Never Used  . Alcohol Use: No    No Known Allergies  Current Outpatient Prescriptions  Medication Sig Dispense Refill  . Calcium-Magnesium-Vitamin D (CALCIUM 500 PO) Take 500 mg by mouth daily.      . cholecalciferol (VITAMIN D) 1000 UNITS tablet Take 1,000 Units by mouth daily.      . ergocalciferol (VITAMIN D2) 50000 UNITS capsule Take 50,000 Units by mouth once a week.      . folic acid (FOLVITE) 1 MG tablet Take 1 mg by mouth daily.      Marland Kitchen ibuprofen (ADVIL,MOTRIN) 800 MG tablet Take 800 mg by mouth every 8 (eight) hours as needed. menstrual cramps      . levothyroxine (SYNTHROID, LEVOTHROID) 112 MCG tablet Take 112 mcg by mouth daily. Takes 112 mcg every day and and 1/2 tablet (37mcg) more on Sunday.      . Multiple  Vitamins-Minerals (MULTIVITAMINS THER. W/MINERALS) TABS Take 1 tablet by mouth daily.      . SUMAtriptan (IMITREX) 50 MG tablet Take 1 tablet (50 mg total) by mouth every 2 (two) hours as needed for migraine. May repeat in 2 hours if headache persists or recurs.  10 tablet  0  . topiramate (TOPAMAX) 50 MG tablet Take 50 mg by mouth once a week.      . doxycycline (VIBRA-TABS) 100 MG tablet Take 1 tablet (100 mg total) by mouth 2 (two) times daily.  14 tablet  2   No current facility-administered medications for this visit.    Review of Systems Review of Systems  Constitutional: Negative.   HENT: Negative.   Eyes: Negative.   Respiratory: Negative.   Cardiovascular: Negative.   Gastrointestinal: Negative.   Endocrine: Negative.   Genitourinary: Negative.   Musculoskeletal: Negative.   Skin: Negative.   Allergic/Immunologic: Negative.   Neurological: Negative.   Hematological: Negative.   Psychiatric/Behavioral: Negative.     Blood pressure 132/78, pulse 76, temperature 97.4 F (36.3 C), height 5\' 7"  (1.702 m), weight 205 lb (92.987 kg).  Physical Exam Physical Exam  Constitutional: She is oriented to person, place, and time. She appears well-developed and well-nourished.  HENT:  Head: Normocephalic and atraumatic.  Eyes: Conjunctivae and EOM are normal. Pupils are  equal, round, and reactive to light.  Neck: Normal range of motion. Neck supple.  Cardiovascular: Normal rate, regular rhythm and normal heart sounds.   Pulmonary/Chest: Effort normal and breath sounds normal.  Abdominal: Soft. Bowel sounds are normal.  Musculoskeletal: Normal range of motion.  There is a moderate-sized fluctuant area in the right axilla. This area was prepped with ChloraPrep and infiltrated with 1% lidocaine. An incision was made through the area with an 11 blade knife. A large amount of purulent material was evacuated. The wound was packed with gauze and dressings were applied. She was instructed on  dressing changes.  Lymphadenopathy:    She has no cervical adenopathy.  Neurological: She is alert and oriented to person, place, and time.  Skin: Skin is warm and dry.  Psychiatric: She has a normal mood and affect. Her behavior is normal.    Data Reviewed As above  Assessment    The patient has a right axillary abscess that we were able to incise and drain in the clinic today. She tolerated this well.     Plan    She will remove the packing tomorrow and begin showering daily with antibacterial soap. She will try to repack the wound if possible. We will see her back in the next week or 2 to recheck the wound        TOTH III,PAUL S 01/18/2014, 4:06 PM

## 2014-01-30 ENCOUNTER — Encounter (INDEPENDENT_AMBULATORY_CARE_PROVIDER_SITE_OTHER): Payer: Self-pay | Admitting: General Surgery

## 2014-01-30 ENCOUNTER — Ambulatory Visit (INDEPENDENT_AMBULATORY_CARE_PROVIDER_SITE_OTHER): Payer: 59 | Admitting: General Surgery

## 2014-01-30 VITALS — BP 122/76 | HR 80 | Temp 99.1°F | Ht 67.0 in | Wt 204.0 lb

## 2014-01-30 DIAGNOSIS — L732 Hidradenitis suppurativa: Secondary | ICD-10-CM

## 2014-01-30 NOTE — Patient Instructions (Signed)
Continue to shower daily and cover with gauze until healed

## 2014-01-30 NOTE — Progress Notes (Signed)
Subjective:     Patient ID: Kimberly Atkinson, female   DOB: February 04, 1976, 38 y.o.   MRN: [id number]  HPI The patient is a 38 year old black female who is 2 weeks status post incision and drainage of a right axillary abscess. She is doing well. Her pain has resolved. She has had minimal drainage from the area.  Review of Systems     Objective:   Physical Exam On exam the wound is open but very shallow with good granulation tissue.    Assessment:     The patient is 2 weeks status post incision and drainage of a right axillary abscess     Plan:     At this point I have encouraged her to continue to shower daily and keep the area covered with gauze until it is completely healed. She will call us if she has any problems in the future.

## 2016-07-21 HISTORY — PX: BREAST BIOPSY: SHX20

## 2016-10-01 ENCOUNTER — Other Ambulatory Visit: Payer: Self-pay | Admitting: Radiology

## 2017-07-22 LAB — PSA: PSA: 0

## 2018-03-03 LAB — HM PAP SMEAR

## 2018-03-08 LAB — HM PAP SMEAR

## 2019-11-30 ENCOUNTER — Encounter: Payer: Self-pay | Admitting: Internal Medicine

## 2019-12-05 ENCOUNTER — Encounter: Payer: Self-pay | Admitting: Internal Medicine

## 2019-12-05 ENCOUNTER — Ambulatory Visit (INDEPENDENT_AMBULATORY_CARE_PROVIDER_SITE_OTHER): Payer: 59 | Admitting: Internal Medicine

## 2019-12-05 ENCOUNTER — Other Ambulatory Visit: Payer: Self-pay

## 2019-12-05 VITALS — BP 118/70 | HR 75 | Temp 98.0°F | Ht 67.0 in | Wt 232.0 lb

## 2019-12-05 DIAGNOSIS — E890 Postprocedural hypothyroidism: Secondary | ICD-10-CM | POA: Diagnosis not present

## 2019-12-05 DIAGNOSIS — D649 Anemia, unspecified: Secondary | ICD-10-CM | POA: Diagnosis not present

## 2019-12-05 DIAGNOSIS — E538 Deficiency of other specified B group vitamins: Secondary | ICD-10-CM

## 2019-12-05 DIAGNOSIS — R7303 Prediabetes: Secondary | ICD-10-CM | POA: Insufficient documentation

## 2019-12-05 DIAGNOSIS — E559 Vitamin D deficiency, unspecified: Secondary | ICD-10-CM | POA: Diagnosis not present

## 2019-12-05 DIAGNOSIS — D509 Iron deficiency anemia, unspecified: Secondary | ICD-10-CM | POA: Insufficient documentation

## 2019-12-05 MED ORDER — LEVOTHYROXINE SODIUM 112 MCG PO TABS: 112.0000 ug | ORAL_TABLET | Freq: Every day | ORAL | 1 refills | Status: DC

## 2019-12-05 NOTE — Progress Notes (Signed)
Date:  12/05/2019   Name:  Kimberly Atkinson   DOB:  [birth year]   MRN:  [id number]   Chief Complaint: Establish Care She has a GYN in [place] but no other specialists. Thyroid Problem Presents for follow-up (on thyroid meds for years since she had ablation for hyperthyroidism) visit. Symptoms include leg swelling and weight gain. Patient reports no anxiety, depressed mood, diaphoresis, menstrual problem or palpitations. The symptoms have been stable.  Diabetes She presents for her follow-up diabetic visit. Diabetes type: pre-diabetes. Her disease course has been stable. Pertinent negatives for hypoglycemia include no dizziness, headaches or nervousness/anxiousness. Pertinent negatives for diabetes include no chest pain. Current diabetic treatment includes diet. She is compliant with treatment most of the time. She is following a generally healthy diet. She participates in exercise intermittently.  Anemia Presents for follow-up (had labs for evaluation with PCP - no cause found.  Takes iron with some benefit.) visit. There has been no abdominal pain, anorexia, fever, light-headedness, malaise/fatigue, palpitations or paresthesias. Signs of blood loss that are not present include melena and menorrhagia. There are no compliance problems.     Lab Results  Component Value Date   CREATININE 1.00 05/08/2013   BUN 5 (L) 05/08/2013   NA 139 05/08/2013   K 3.7 05/08/2013   CL 101 05/08/2013   Lab Results  Component Value Date   WBC 7.9 08/19/2011   HGB 11.6 (L) 05/08/2013   HCT 34.0 (L) 05/08/2013   MCV 80.7 08/19/2011   PLT 305 08/19/2011    Review of Systems  Constitutional: Positive for weight gain. Negative for diaphoresis, fever, malaise/fatigue and unexpected weight change.  Respiratory: Negative for chest tightness and shortness of breath.   Cardiovascular: Positive for leg swelling. Negative for chest pain and palpitations.  Gastrointestinal: Negative for abdominal pain,  anorexia and melena.  Genitourinary: Negative for menorrhagia and menstrual problem.  Neurological: Negative for dizziness, light-headedness, headaches and paresthesias.  Psychiatric/Behavioral: Negative for dysphoric mood and sleep disturbance. The patient is not nervous/anxious.     Patient Active Problem List   Diagnosis Date Noted  . Hypothyroidism (acquired) 12/05/2019  . Pre-diabetes 12/05/2019  . Vitamin D deficiency 12/05/2019  . Hidradenitis axillaris 11/18/2011    No Known Allergies  Past Surgical History:  Procedure Laterality Date  . LAPAROSCOPY  08/28/2011   Procedure: LAPAROSCOPY OPERATIVE;  Surgeon: Marvene Staff, MD;  Location: [facility];  Service: Gynecology;  Laterality: Right;  Fugeration of Endometriosis, Removal of Left Paratubal Cyst./ Chromopertubation    Social History   Tobacco Use  . Smoking status: Never Smoker  . Smokeless tobacco: Never Used  Substance Use Topics  . Alcohol use: No  . Drug use: No     Medication list has been reviewed and updated.  Current Meds  Medication Sig  . ergocalciferol (VITAMIN D2) 50000 UNITS capsule Take 50,000 Units by mouth once a week.  . FeFum-FePoly-FA-B Cmp-C-Biot (FOLIVANE-PLUS PO) Take 1 capsule by mouth daily.  . folic acid (FOLVITE) 1 MG tablet Take 1 mg by mouth daily.  Marland Kitchen ibuprofen (ADVIL,MOTRIN) 800 MG tablet Take 800 mg by mouth every 8 (eight) hours as needed. menstrual cramps  . levothyroxine (SYNTHROID, LEVOTHROID) 112 MCG tablet Take 112 mcg by mouth daily. Takes 112 mcg every day and and 1/2 tablet (73mcg) more on Sunday.  . Multiple Vitamins-Minerals (MULTIVITAMINS THER. W/MINERALS) TABS Take 1 tablet by mouth daily.    PHQ 2/9 Scores 12/05/2019  PHQ - 2 Score 0  PHQ- 9 Score 4    BP Readings from Last 3 Encounters:  12/05/19 118/70  01/30/14 122/76  01/18/14 132/78    Physical Exam Vitals and nursing note reviewed.  Constitutional:      General: She is not in acute distress.     Appearance: She is well-developed.  HENT:     Head: Normocephalic and atraumatic.  Cardiovascular:     Rate and Rhythm: Normal rate and regular rhythm.     Pulses: Normal pulses.     Heart sounds: No murmur.  Pulmonary:     Effort: Pulmonary effort is normal. No respiratory distress.     Breath sounds: No wheezing or rhonchi.  Musculoskeletal:        General: No swelling or tenderness.     Right lower leg: No edema.     Left lower leg: No edema.  Lymphadenopathy:     Cervical: No cervical adenopathy.  Skin:    General: Skin is warm and dry.     Capillary Refill: Capillary refill takes less than 2 seconds.     Findings: No rash.  Neurological:     General: No focal deficit present.     Mental Status: She is alert and oriented to person, place, and time.  Psychiatric:        Mood and Affect: Mood normal.        Behavior: Behavior normal.     Wt Readings from Last 3 Encounters:  12/05/19 232 lb (105.2 kg)  01/30/14 204 lb (92.5 kg)  01/18/14 205 lb (93 kg)    BP 118/70   Pulse 75   Temp 98 F (36.7 C) (Temporal)   Ht 5\' 7"  (1.702 m)   Wt 232 lb (105.2 kg)   LMP 12/05/2019 (Exact Date)   SpO2 100%   BMI 36.34 kg/m   Assessment and Plan: 1. Postablative hypothyroidism Mild end of the day edema secondary to sedentary habits - recommend walking at lunch each day for 15-20 minutes - levothyroxine (SYNTHROID) 112 MCG tablet; Take 1 tablet (112 mcg total) by mouth daily. Takes 112 mcg every day and and 1/2 tablet (64mcg) more on Sunday.  Dispense: 102 tablet; Refill: 1 - TSH + free T4  2. Pre-diabetes Continue healthy diet; efforts at weight loss - Comprehensive metabolic panel - Hemoglobin A1c - Lipid panel  3. Vitamin D deficiency Supplemented with high dose weekly plus 2000 IU daily - VITAMIN D 25 Hydroxy (Vit-D Deficiency, Fractures)  4. Anemia, unspecified type Will recheck then discuss Hematology referral Will need labs from previous PCP - CBC with  Differential/Platelet  5. B12 nutritional deficiency supplemented - Vitamin B12   Partially dictated using Editor, commissioning. Any errors are unintentional.  Halina Maidens, MD [facility]  12/05/2019

## 2019-12-06 LAB — COMPREHENSIVE METABOLIC PANEL
ALT: 17 IU/L (ref 0–32)
AST: 22 IU/L (ref 0–40)
Albumin/Globulin Ratio: 1.5 (ref 1.2–2.2)
Albumin: 4 g/dL (ref 3.8–4.8)
Alkaline Phosphatase: 49 IU/L (ref 48–121)
BUN/Creatinine Ratio: 6 — ABNORMAL LOW (ref 9–23)
BUN: 5 mg/dL — ABNORMAL LOW (ref 6–24)
Bilirubin Total: 0.2 mg/dL (ref 0.0–1.2)
CO2: 24 mmol/L (ref 20–29)
Calcium: 9.5 mg/dL (ref 8.7–10.2)
Chloride: 102 mmol/L (ref 96–106)
Creatinine, Ser: 0.79 mg/dL (ref 0.57–1.00)
GFR calc Af Amer: 106 mL/min/{1.73_m2} (ref >59)
GFR calc non Af Amer: 92 mL/min/{1.73_m2} (ref >59)
Globulin, Total: 2.7 g/dL (ref 1.5–4.5)
Glucose: 108 mg/dL — ABNORMAL HIGH (ref 65–99)
Potassium: 3.8 mmol/L (ref 3.5–5.2)
Sodium: 136 mmol/L (ref 134–144)
Total Protein: 6.7 g/dL (ref 6.0–8.5)

## 2019-12-06 LAB — CBC WITH DIFFERENTIAL/PLATELET
Basophils Absolute: 0.1 10*3/uL (ref 0.0–0.2)
Basos: 1 %
EOS (ABSOLUTE): 0.3 10*3/uL (ref 0.0–0.4)
Eos: 3 %
Hematocrit: 34.6 % (ref 34.0–46.6)
Hemoglobin: 10.8 g/dL — ABNORMAL LOW (ref 11.1–15.9)
Immature Grans (Abs): 0 10*3/uL (ref 0.0–0.1)
Immature Granulocytes: 0 %
Lymphocytes Absolute: 3.2 10*3/uL — ABNORMAL HIGH (ref 0.7–3.1)
Lymphs: 40 %
MCH: 24.7 pg — ABNORMAL LOW (ref 26.6–33.0)
MCHC: 31.2 g/dL — ABNORMAL LOW (ref 31.5–35.7)
MCV: 79 fL (ref 79–97)
Monocytes Absolute: 0.4 10*3/uL (ref 0.1–0.9)
Monocytes: 5 %
Neutrophils Absolute: 4 10*3/uL (ref 1.4–7.0)
Neutrophils: 51 %
Platelets: 316 10*3/uL (ref 150–450)
RBC: 4.37 x10E6/uL (ref 3.77–5.28)
RDW: 15.7 % — ABNORMAL HIGH (ref 11.7–15.4)
WBC: 7.9 10*3/uL (ref 3.4–10.8)

## 2019-12-06 LAB — HEMOGLOBIN A1C
Est. average glucose Bld gHb Est-mCnc: 123 mg/dL
Hgb A1c MFr Bld: 5.9 % — ABNORMAL HIGH (ref 4.8–5.6)

## 2019-12-06 LAB — LIPID PANEL
Chol/HDL Ratio: 3.8 ratio (ref 0.0–4.4)
Cholesterol, Total: 243 mg/dL — ABNORMAL HIGH (ref 100–199)
HDL: 64 mg/dL (ref >39)
LDL Chol Calc (NIH): 147 mg/dL — ABNORMAL HIGH (ref 0–99)
Triglycerides: 179 mg/dL — ABNORMAL HIGH (ref 0–149)
VLDL Cholesterol Cal: 32 mg/dL (ref 5–40)

## 2019-12-06 LAB — VITAMIN D 25 HYDROXY (VIT D DEFICIENCY, FRACTURES): Vit D, 25-Hydroxy: 25.5 ng/mL — ABNORMAL LOW (ref 30.0–100.0)

## 2019-12-06 LAB — VITAMIN B12: Vitamin B-12: 1152 pg/mL (ref 232–1245)

## 2019-12-06 LAB — TSH+FREE T4
Free T4: 0.18 ng/dL — ABNORMAL LOW (ref 0.82–1.77)
TSH: 91 u[IU]/mL — ABNORMAL HIGH (ref 0.450–4.500)

## 2020-02-14 ENCOUNTER — Other Ambulatory Visit: Payer: Self-pay | Admitting: Internal Medicine

## 2020-02-14 MED ORDER — ERGOCALCIFEROL 1.25 MG (50000 UT) PO CAPS: 50000.0000 [IU] | ORAL_CAPSULE | ORAL | 1 refills | Status: DC

## 2020-02-14 NOTE — Telephone Encounter (Signed)
RX REFILL ergocalciferol (VITAMIN D2) 50000 UNITS capsule [[id number]]  [place], [place] Phone:  [phone]  Fax:  [phone]

## 2020-02-14 NOTE — Telephone Encounter (Signed)
Patient called about the refill request. I advised in the result note on 12/05/19 it was noted to take the vitamin D supplement daily. She says what she takes is Vitamin D 2000 IU daily and Vitamin D 50,000 IU weekly. She says she will need the refill. I advised I will send to Dr. Army Melia for approval of refill.

## 2020-02-14 NOTE — Telephone Encounter (Signed)
Requested medication (s) are due for refill today: Yes  Requested medication (s) are on the active medication list: Yes  Last refill:  09/19/13  Future visit scheduled: Yes  Notes to clinic:  Historical provider and prescription has expired.    Requested Prescriptions  Pending Prescriptions Disp Refills   ergocalciferol (VITAMIN D2) 1.25 MG (50000 UT) capsule      Sig: Take 1 capsule (50,000 Units total) by mouth once a week.      Endocrinology:  Vitamins - Vitamin D Supplementation Failed - 02/14/2020 12:10 PM      Failed - 50,000 IU strengths are not delegated      Failed - Phosphate in normal range and within 360 days    No results found for: PHOS        Failed - Vitamin D in normal range and within 360 days    Vit D, 25-Hydroxy  Date Value Ref Range Status  12/05/2019 25.5 (L) 30.0 - 100.0 ng/mL Final    Comment:    Vitamin D deficiency has been defined by the Institute of Medicine and an Endocrine Society practice guideline as a level of serum 25-OH vitamin D less than 20 ng/mL (1,2). The Endocrine Society went on to further define vitamin D insufficiency as a level between 21 and 29 ng/mL (2). 1. IOM (Institute of Medicine). 2010. Dietary reference    intakes for calcium and D. [place]: The    [employer]. 2. Holick MF, Binkley , Bischoff-Ferrari HA, et al.    Evaluation, treatment, and prevention of vitamin D    deficiency: an Endocrine Society clinical practice    guideline. JCEM. 2011 Jul; 96(7):1911-30.           Passed - Ca in normal range and within 360 days    Calcium  Date Value Ref Range Status  12/05/2019 9.5 8.7 - 10.2 mg/dL Final   Calcium, Ion  Date Value Ref Range Status  05/08/2013 1.26 (H) 1.12 - 1.23 mmol/L Final          Passed - Valid encounter within last 12 months    Recent Outpatient Visits           2 months ago Postablative hypothyroidism   [facility] Glean Hess, MD       Future  Appointments             In 1 month Army Melia, Jesse Sans, MD [facility]

## 2020-03-20 ENCOUNTER — Encounter: Payer: Self-pay | Admitting: Internal Medicine

## 2020-03-27 ENCOUNTER — Encounter: Payer: Self-pay | Admitting: Internal Medicine

## 2020-03-27 ENCOUNTER — Ambulatory Visit (INDEPENDENT_AMBULATORY_CARE_PROVIDER_SITE_OTHER): Payer: 59 | Admitting: Internal Medicine

## 2020-03-27 ENCOUNTER — Other Ambulatory Visit: Payer: Self-pay

## 2020-03-27 VITALS — BP 122/78 | HR 75 | Ht 67.0 in | Wt 233.0 lb

## 2020-03-27 DIAGNOSIS — M545 Low back pain, unspecified: Secondary | ICD-10-CM

## 2020-03-27 DIAGNOSIS — E559 Vitamin D deficiency, unspecified: Secondary | ICD-10-CM

## 2020-03-27 DIAGNOSIS — E890 Postprocedural hypothyroidism: Secondary | ICD-10-CM | POA: Diagnosis not present

## 2020-03-27 DIAGNOSIS — D649 Anemia, unspecified: Secondary | ICD-10-CM

## 2020-03-27 NOTE — Progress Notes (Signed)
Date:  03/27/2020   Name:  Kimberly Atkinson   DOB:  [birth year]   MRN:  [id number]   Chief Complaint: Hypothyroidism and Anemia  Anemia Presents for follow-up visit. There has been no abdominal pain, fever or palpitations. (Started on multivit with iron last visit ) Signs of blood loss that are not present include hematochezia and menorrhagia.  Thyroid Problem Presents for follow-up visit. Patient reports no fatigue, palpitations or tremors. The symptoms have been improving (had not been taking meds regularly last visit).  Back Pain This is a new problem. The pain is present in the lumbar spine. The quality of the pain is described as aching. The pain does not radiate. The pain is mild. Pertinent negatives include no abdominal pain, chest pain, dysuria, fever, headaches, numbness, tingling or weakness. She has tried NSAIDs (lidoderm patches) for the symptoms. The treatment provided moderate relief.  Vitamin D def - started on high dose weekly in May.  Lab Results  Component Value Date   CREATININE 0.79 12/05/2019   BUN 5 (L) 12/05/2019   NA 136 12/05/2019   K 3.8 12/05/2019   CL 102 12/05/2019   CO2 24 12/05/2019   Lab Results  Component Value Date   CHOL 243 (H) 12/05/2019   HDL 64 12/05/2019   LDLCALC 147 (H) 12/05/2019   TRIG 179 (H) 12/05/2019   CHOLHDL 3.8 12/05/2019   Lab Results  Component Value Date   TSH 91.000 (H) 12/05/2019   Lab Results  Component Value Date   HGBA1C 5.9 (H) 12/05/2019   Lab Results  Component Value Date   WBC 7.9 12/05/2019   HGB 10.8 (L) 12/05/2019   HCT 34.6 12/05/2019   MCV 79 12/05/2019   PLT 316 12/05/2019   Lab Results  Component Value Date   ALT 17 12/05/2019   AST 22 12/05/2019   ALKPHOS 49 12/05/2019   BILITOT 0.2 12/05/2019   Last vitamin D Lab Results  Component Value Date   VD25OH 25.5 (L) 12/05/2019      Review of Systems  Constitutional: Negative for appetite change, fatigue, fever and unexpected weight  change.  HENT: Negative for tinnitus and trouble swallowing.   Eyes: Negative for visual disturbance.  Respiratory: Negative for cough, chest tightness and shortness of breath.   Cardiovascular: Negative for chest pain, palpitations and leg swelling.  Gastrointestinal: Negative for abdominal pain and hematochezia.  Genitourinary: Negative for dysuria, hematuria and menorrhagia.  Musculoskeletal: Positive for back pain. Negative for arthralgias.  Neurological: Negative for tingling, tremors, weakness, numbness and headaches.  Psychiatric/Behavioral: Negative for dysphoric mood.    Patient Active Problem List   Diagnosis Date Noted  . Pre-diabetes 12/05/2019  . Vitamin D deficiency 12/05/2019  . Postablative hypothyroidism 12/05/2019  . Anemia 12/05/2019  . B12 nutritional deficiency 12/05/2019  . Hidradenitis axillaris 11/18/2011    No Known Allergies  Past Surgical History:  Procedure Laterality Date  . BREAST BIOPSY Left 2018   fibroadenoma  . LAPAROSCOPY  08/28/2011   Procedure: LAPAROSCOPY OPERATIVE;  Surgeon: Marvene Staff, MD;  Location: [facility];  Service: Gynecology;  Laterality: Right;  Fugeration of Endometriosis, Removal of Left Paratubal Cyst./ Chromopertubation    Social History   Tobacco Use  . Smoking status: Never Smoker  . Smokeless tobacco: Never Used  Vaping Use  . Vaping Use: Never used  Substance Use Topics  . Alcohol use: No  . Drug use: No     Medication list has been reviewed  and updated.  Current Meds  Medication Sig  . cyanocobalamin 100 MCG tablet Take 100 mcg by mouth daily.  . ergocalciferol (VITAMIN D2) 1.25 MG (50000 UT) capsule Take 1 capsule (50,000 Units total) by mouth once a week.  . FeFum-FePoly-FA-B Cmp-C-Biot (FOLIVANE-PLUS PO) Take 1 capsule by mouth daily.  . folic acid (FOLVITE) 1 MG tablet Take 1 mg by mouth daily.  Marland Kitchen ibuprofen (ADVIL,MOTRIN) 800 MG tablet Take 800 mg by mouth every 8 (eight) hours as needed. menstrual  cramps  . levothyroxine (SYNTHROID) 112 MCG tablet Take 1 tablet (112 mcg total) by mouth daily. Takes 112 mcg every day and and 1/2 tablet (23mcg) more on Sunday.  . Multiple Vitamins-Minerals (MULTIVITAMINS THER. W/MINERALS) TABS Take 1 tablet by mouth daily.    PHQ 2/9 Scores 12/05/2019  PHQ - 2 Score 0  PHQ- 9 Score 4    GAD 7 : Generalized Anxiety Score 12/05/2019  Nervous, Anxious, on Edge 0  Control/stop worrying 0  Worry too much - different things 0  Trouble relaxing 0  Restless 0  Easily annoyed or irritable 0  Afraid - awful might happen 0  Total GAD 7 Score 0  Anxiety Difficulty Not difficult at all    BP Readings from Last 3 Encounters:  03/27/20 122/78  12/05/19 118/70  01/30/14 122/76    Physical Exam Vitals and nursing note reviewed.  Constitutional:      General: She is not in acute distress.    Appearance: She is well-developed.  HENT:     Head: Normocephalic and atraumatic.  Neck:     Thyroid: No thyroid mass, thyromegaly or thyroid tenderness.  Cardiovascular:     Rate and Rhythm: Normal rate and regular rhythm.  Pulmonary:     Effort: Pulmonary effort is normal. No respiratory distress.     Breath sounds: No wheezing or rhonchi.  Musculoskeletal:        General: Normal range of motion.     Cervical back: Normal range of motion and neck supple.     Lumbar back: No spasms or tenderness. Normal range of motion. Negative right straight leg raise test and negative left straight leg raise test.  Lymphadenopathy:     Cervical: No cervical adenopathy.  Skin:    General: Skin is warm and dry.     Findings: No rash.  Neurological:     Mental Status: She is alert and oriented to person, place, and time.  Psychiatric:        Behavior: Behavior normal.        Thought Content: Thought content normal.     Wt Readings from Last 3 Encounters:  03/27/20 233 lb (105.7 kg)  12/05/19 232 lb (105.2 kg)  01/30/14 204 lb (92.5 kg)    BP 122/78   Pulse 75    Ht 5\' 7"  (1.702 m)   Wt 233 lb (105.7 kg)   LMP 03/18/2020 (Exact Date)   SpO2 99%   BMI 36.49 kg/m   Assessment and Plan: 1. Postablative hypothyroidism Now supplemented every day; will adjust dose if needed - Thyroid Panel With TSH  2. Vitamin D deficiency On high dose weekly plus 2000 IU daily - VITAMIN D 25 Hydroxy (Vit-D Deficiency, Fractures)  3. Anemia, unspecified type Continue iron supplements May be due to ongoing menstrual losses - CBC with Differential/Platelet - Iron, TIBC and Ferritin Panel  4. Midline low back pain without sciatica, unspecified chronicity Patient believes that this could be due to fibroids I  recommend that she see her GYN - can refer to local GYN if desired   Partially dictated using Editor, commissioning. Any errors are unintentional.  Halina Maidens, MD [facility]  03/27/2020   Partially dictated using Dragon software. Any errors are unintentional.  Halina Maidens, MD [facility]  03/27/2020

## 2020-03-28 LAB — IRON,TIBC AND FERRITIN PANEL
Ferritin: 11 ng/mL — ABNORMAL LOW (ref 15–150)
Iron Saturation: 8 % — CL (ref 15–55)
Iron: 27 ug/dL (ref 27–159)
Total Iron Binding Capacity: 319 ug/dL (ref 250–450)
UIBC: 292 ug/dL (ref 131–425)

## 2020-03-28 LAB — CBC WITH DIFFERENTIAL/PLATELET
Basophils Absolute: 0.1 10*3/uL (ref 0.0–0.2)
Basos: 1 %
EOS (ABSOLUTE): 0.4 10*3/uL (ref 0.0–0.4)
Eos: 5 %
Hematocrit: 30.6 % — ABNORMAL LOW (ref 34.0–46.6)
Hemoglobin: 9.5 g/dL — ABNORMAL LOW (ref 11.1–15.9)
Immature Grans (Abs): 0 10*3/uL (ref 0.0–0.1)
Immature Granulocytes: 0 %
Lymphocytes Absolute: 2.7 10*3/uL (ref 0.7–3.1)
Lymphs: 35 %
MCH: 24.4 pg — ABNORMAL LOW (ref 26.6–33.0)
MCHC: 31 g/dL — ABNORMAL LOW (ref 31.5–35.7)
MCV: 79 fL (ref 79–97)
Monocytes Absolute: 0.5 10*3/uL (ref 0.1–0.9)
Monocytes: 7 %
Neutrophils Absolute: 4 10*3/uL (ref 1.4–7.0)
Neutrophils: 52 %
Platelets: 344 10*3/uL (ref 150–450)
RBC: 3.9 x10E6/uL (ref 3.77–5.28)
RDW: 14 % (ref 11.7–15.4)
WBC: 7.7 10*3/uL (ref 3.4–10.8)

## 2020-03-28 LAB — THYROID PANEL WITH TSH
Free Thyroxine Index: 2.2 (ref 1.2–4.9)
T3 Uptake Ratio: 26 % (ref 24–39)
T4, Total: 8.5 ug/dL (ref 4.5–12.0)
TSH: 1.43 u[IU]/mL (ref 0.450–4.500)

## 2020-03-28 LAB — VITAMIN D 25 HYDROXY (VIT D DEFICIENCY, FRACTURES): Vit D, 25-Hydroxy: 35.8 ng/mL (ref 30.0–100.0)

## 2020-04-26 ENCOUNTER — Other Ambulatory Visit: Payer: Self-pay

## 2020-04-26 ENCOUNTER — Other Ambulatory Visit: Payer: 59

## 2020-04-26 ENCOUNTER — Other Ambulatory Visit: Payer: Self-pay | Admitting: Internal Medicine

## 2020-04-26 DIAGNOSIS — D509 Iron deficiency anemia, unspecified: Secondary | ICD-10-CM

## 2020-04-27 LAB — CBC WITH DIFFERENTIAL/PLATELET
Basophils Absolute: 0.1 10*3/uL (ref 0.0–0.2)
Basos: 1 %
EOS (ABSOLUTE): 0.2 10*3/uL (ref 0.0–0.4)
Eos: 3 %
Hematocrit: 34.3 % (ref 34.0–46.6)
Hemoglobin: 10.3 g/dL — ABNORMAL LOW (ref 11.1–15.9)
Immature Grans (Abs): 0 10*3/uL (ref 0.0–0.1)
Immature Granulocytes: 0 %
Lymphocytes Absolute: 2.8 10*3/uL (ref 0.7–3.1)
Lymphs: 38 %
MCH: 23.5 pg — ABNORMAL LOW (ref 26.6–33.0)
MCHC: 30 g/dL — ABNORMAL LOW (ref 31.5–35.7)
MCV: 78 fL — ABNORMAL LOW (ref 79–97)
Monocytes Absolute: 0.6 10*3/uL (ref 0.1–0.9)
Monocytes: 8 %
Neutrophils Absolute: 3.6 10*3/uL (ref 1.4–7.0)
Neutrophils: 50 %
Platelets: 351 10*3/uL (ref 150–450)
RBC: 4.38 x10E6/uL (ref 3.77–5.28)
RDW: 14.8 % (ref 11.7–15.4)
WBC: 7.4 10*3/uL (ref 3.4–10.8)

## 2020-04-27 LAB — IRON,TIBC AND FERRITIN PANEL
Ferritin: 15 ng/mL (ref 15–150)
Iron Saturation: 8 % — CL (ref 15–55)
Iron: 25 ug/dL — ABNORMAL LOW (ref 27–159)
Total Iron Binding Capacity: 304 ug/dL (ref 250–450)
UIBC: 279 ug/dL (ref 131–425)

## 2020-04-30 ENCOUNTER — Other Ambulatory Visit: Payer: Self-pay

## 2020-04-30 DIAGNOSIS — R790 Abnormal level of blood mineral: Secondary | ICD-10-CM

## 2020-04-30 DIAGNOSIS — R7989 Other specified abnormal findings of blood chemistry: Secondary | ICD-10-CM

## 2020-05-09 ENCOUNTER — Encounter: Payer: Self-pay | Admitting: Oncology

## 2020-05-09 ENCOUNTER — Other Ambulatory Visit: Payer: Self-pay

## 2020-05-09 ENCOUNTER — Inpatient Hospital Stay: Payer: 59 | Attending: Oncology | Admitting: Oncology

## 2020-05-09 ENCOUNTER — Inpatient Hospital Stay: Payer: 59

## 2020-05-09 VITALS — BP 118/87 | HR 67 | Temp 97.5°F | Resp 18 | Ht 67.5 in | Wt 229.7 lb

## 2020-05-09 DIAGNOSIS — D509 Iron deficiency anemia, unspecified: Secondary | ICD-10-CM | POA: Diagnosis present

## 2020-05-09 NOTE — Progress Notes (Signed)
Hematology/Oncology Consult note [facility] [phone] [phone]   Patient Care Team: Glean Hess, MD as PCP - General (Internal Medicine) Servando Salina, MD as Consulting Physician (Obstetrics and Gynecology)  REFERRING PROVIDER: Glean Hess, MD CHIEF COMPLAINTS/REASON FOR VISIT:  Evaluation of iron deficiency anemia  HISTORY OF PRESENTING ILLNESS:  Kimberly Atkinson is a  44 y.o.  female with PMH listed below was seen in consultation at the request of Glean Hess, MD   for evaluation of iron deficiency anemia.   Reviewed patient's recent labs  04/26/2020 labs revealed anemia with hemoglobin of 10.3, MCV 78, ferritin 15, iron saturation 8, TIBC 304. 03/27/2020, labs showed hemoglobin 9.5, MCV 79, ferritin 8, iron saturation 11. Reviewed patient's previous labs ordered by primary care physician's office, anemia is chronic onset , duration is since at least 2013.  Hemoglobin was 11.3 at that time. No aggravating or improving factors.  Associated signs and symptoms: Patient reports fatigue.  Denies SOB with exertion.  Denies weight loss, easy bruising, hematochezia, hemoptysis, hematuria. Context:  History of iron deficiency: Patient takes oral iron supplementation- Folivane  Rectal bleeding: Denies.  Denies any family history of colon cancer Menstrual bleeding/ Vaginal bleeding : Patient reports normal menstrual flow, which usually lasts 5 days with 2 out of the 5 days heavy.  She usually use 5-6 pads on the heaviest day. Hematemesis or hemoptysis : denies Blood in urine : denies      Review of Systems  Constitutional: Positive for fatigue. Negative for appetite change, chills and fever.  HENT:   Negative for hearing loss and voice change.   Eyes: Negative for eye problems.  Respiratory: Negative for chest tightness and cough.   Cardiovascular: Negative for chest pain.  Gastrointestinal: Negative for abdominal  distention, abdominal pain and blood in stool.  Endocrine: Negative for hot flashes.  Genitourinary: Negative for difficulty urinating and frequency.   Musculoskeletal: Negative for arthralgias.  Skin: Negative for itching and rash.  Neurological: Negative for extremity weakness.  Hematological: Negative for adenopathy.  Psychiatric/Behavioral: Negative for confusion.    MEDICAL HISTORY:  Past Medical History:  Diagnosis Date  . Anemia    Takes iron and folic acid  . Anxiety   . Diabetes mellitus without complication (HCC)    Pre- diabetic  . GERD (gastroesophageal reflux disease)    no meds at present  . Headache   . Hypothyroidism   . Insomnia   . Piriformis syndrome   . Situational depression   . Vitamin B deficiency   . Vitamin D deficiency     SURGICAL HISTORY: Past Surgical History:  Procedure Laterality Date  . BREAST BIOPSY Left 2018   fibroadenoma  . LAPAROSCOPY  08/28/2011   Procedure: LAPAROSCOPY OPERATIVE;  Surgeon: Marvene Staff, MD;  Location: [facility];  Service: Gynecology;  Laterality: Right;  Fugeration of Endometriosis, Removal of Left Paratubal Cyst./ Chromopertubation    SOCIAL HISTORY: Social History   Socioeconomic History  . Marital status: Single    Spouse name: Not on file  . Number of children: 0  . Years of education: Not on file  . Highest education level: Not on file  Occupational History  . Not on file  Tobacco Use  . Smoking status: Never Smoker  . Smokeless tobacco: Never Used  Vaping Use  . Vaping Use: Never used  Substance and Sexual Activity  . Alcohol use: No  . Drug use: No  . Sexual activity: Not  Currently    Birth control/protection: None  Other Topics Concern  . Not on file  Social History Narrative  . Not on file   Social Determinants of Health   Financial Resource Strain:   . Difficulty of Paying Living Expenses: Not on file  Food Insecurity:   . Worried About Charity fundraiser in the Last Year: Not on  file  . Ran Out of Food in the Last Year: Not on file  Transportation Needs:   . Lack of Transportation (Medical): Not on file  . Lack of Transportation (Non-Medical): Not on file  Physical Activity:   . Days of Exercise per Week: Not on file  . Minutes of Exercise per Session: Not on file  Stress:   . Feeling of Stress : Not on file  Social Connections:   . Frequency of Communication with Friends and Family: Not on file  . Frequency of Social Gatherings with Friends and Family: Not on file  . Attends Religious Services: Not on file  . Active Member of Clubs or Organizations: Not on file  . Attends Archivist Meetings: Not on file  . Marital Status: Not on file  Intimate Partner Violence:   . Fear of Current or Ex-Partner: Not on file  . Emotionally Abused: Not on file  . Physically Abused: Not on file  . Sexually Abused: Not on file    FAMILY HISTORY: Family History  Problem Relation Age of Onset  . Diabetes Mother   . Hypertension Mother   . Hyperlipidemia Mother     ALLERGIES:  has No Known Allergies.  MEDICATIONS:  Current Outpatient Medications  Medication Sig Dispense Refill  . cyanocobalamin 100 MCG tablet Take 100 mcg by mouth daily.    . ergocalciferol (VITAMIN D2) 1.25 MG (50000 UT) capsule Take 1 capsule (50,000 Units total) by mouth once a week. 12 capsule 1  . FeFum-FePoly-FA-B Cmp-C-Biot (FOLIVANE-PLUS PO) Take 1 capsule by mouth daily.    . folic acid (FOLVITE) 1 MG tablet Take 1 mg by mouth daily.    Marland Kitchen ibuprofen (ADVIL,MOTRIN) 800 MG tablet Take 800 mg by mouth every 8 (eight) hours as needed. menstrual cramps    . levothyroxine (SYNTHROID) 112 MCG tablet Take 1 tablet (112 mcg total) by mouth daily. Takes 112 mcg every day and and 1/2 tablet (37mcg) more on Sunday. 102 tablet 1  . Multiple Vitamins-Minerals (MULTIVITAMINS THER. W/MINERALS) TABS Take 1 tablet by mouth daily.     No current facility-administered medications for this visit.      PHYSICAL EXAMINATION: ECOG PERFORMANCE STATUS: 0 - Asymptomatic Vitals:   05/09/20 0949  BP: 118/87  Pulse: 67  Resp: 18  Temp: (!) 97.5 F (36.4 C)   Filed Weights   05/09/20 0949  Weight: 229 lb 11.2 oz (104.2 kg)    Physical Exam Constitutional:      General: She is not in acute distress. HENT:     Head: Normocephalic and atraumatic.  Eyes:     General: No scleral icterus. Cardiovascular:     Rate and Rhythm: Normal rate and regular rhythm.     Heart sounds: Normal heart sounds.  Pulmonary:     Effort: Pulmonary effort is normal. No respiratory distress.     Breath sounds: No wheezing.  Abdominal:     General: Bowel sounds are normal. There is no distension.     Palpations: Abdomen is soft.  Musculoskeletal:        General: No deformity.  Normal range of motion.     Cervical back: Normal range of motion and neck supple.  Skin:    General: Skin is warm and dry.     Findings: No erythema or rash.  Neurological:     Mental Status: She is alert and oriented to person, place, and time. Mental status is at baseline.     Cranial Nerves: No cranial nerve deficit.     Coordination: Coordination normal.  Psychiatric:        Mood and Affect: Mood normal.       CMP Latest Ref Rng & Units 12/05/2019  Glucose 65 - 99 mg/dL 108(H)  BUN 6 - 24 mg/dL 5(L)  Creatinine 0.57 - 1.00 mg/dL 0.79  Sodium 134 - 144 mmol/L 136  Potassium 3.5 - 5.2 mmol/L 3.8  Chloride 96 - 106 mmol/L 102  CO2 20 - 29 mmol/L 24  Calcium 8.7 - 10.2 mg/dL 9.5  Total Protein 6.0 - 8.5 g/dL 6.7  Total Bilirubin 0.0 - 1.2 mg/dL 0.2  Alkaline Phos 48 - 121 IU/L 49  AST 0 - 40 IU/L 22  ALT 0 - 32 IU/L 17   CBC Latest Ref Rng & Units 04/26/2020  WBC 3.4 - 10.8 x10E3/uL 7.4  Hemoglobin 11.1 - 15.9 g/dL 10.3(L)  Hematocrit 34.0 - 46.6 % 34.3  Platelets 150 - 450 x10E3/uL 351     LABORATORY DATA:  I have reviewed the data as listed Lab Results  Component Value Date   WBC 7.4 04/26/2020    HGB 10.3 (L) 04/26/2020   HCT 34.3 04/26/2020   MCV 78 (L) 04/26/2020   PLT 351 04/26/2020   Recent Labs    12/05/19 1522  NA 136  K 3.8  CL 102  CO2 24  GLUCOSE 108*  BUN 5*  CREATININE 0.79  CALCIUM 9.5  GFRNONAA 92  GFRAA 106  PROT 6.7  ALBUMIN 4.0  AST 22  ALT 17  ALKPHOS 49  BILITOT 0.2   Iron/TIBC/Ferritin/ %Sat    Component Value Date/Time   IRON 25 (L) 04/26/2020 0850   TIBC 304 04/26/2020 0850   FERRITIN 15 04/26/2020 0850   IRONPCTSAT 8 (LL) 04/26/2020 0850     RADIOGRAPHIC STUDIES: I have personally reviewed the radiological images as listed and agreed with the findings in the report. No results found.     ASSESSMENT & PLAN:  1. Iron deficiency anemia, unspecified iron deficiency anemia type    Labs are reviewed and discussed with patient. Consistent with iron deficiency anemia. I discussed about option of continued oral iron supplementation versus IV Venofer treatment options. Patient reports tolerating Lavone Orn however she is not taking on daily basis due to constipation. I suggest her to try adding stool softener Colace 100 mg daily to see if that would help her to stay on oral iron supplementation daily. Allergy reactions/infusion reaction including anaphylactic reaction discussed with patient. Other side effects include but not limited to high blood pressure, skin rash, weight gain, leg swelling, etc. Patient voices understanding and she prefers to try to take oral iron supplementation daily and see if she has any improvement with her blood work before deciding IV iron treatments. Etiology of iron deficiency, blood loss due to menses versus underlying GI blood loss.  Discussed about GI work-up and she will think about that. I will see her in 8 weeks with repeat blood work and reevaluation.  Orders Placed This Encounter  Procedures  . CBC with Differential/Platelet    Standing  Status:   Future    Standing Expiration Date:   05/09/2021  .  Comprehensive metabolic panel    Standing Status:   Future    Standing Expiration Date:   05/09/2021  . Ferritin    Standing Status:   Future    Standing Expiration Date:   05/09/2021  . Iron and TIBC    Standing Status:   Future    Standing Expiration Date:   05/09/2021    All questions were answered. The patient knows to call the clinic with any problems questions or concerns.  Cc Glean Hess, MD  Return of visit: 8 weeks Thank you for this kind referral and the opportunity to participate in the care of this patient. A copy of today's note is routed to referring provider   Earlie Server, MD, PhD Hematology Oncology [place] [facility] 05/09/2020

## 2020-05-09 NOTE — Progress Notes (Signed)
Pt here to establish care.

## 2020-06-05 ENCOUNTER — Other Ambulatory Visit: Payer: Self-pay

## 2020-06-05 DIAGNOSIS — E890 Postprocedural hypothyroidism: Secondary | ICD-10-CM

## 2020-06-05 MED ORDER — LEVOTHYROXINE SODIUM 112 MCG PO TABS: 112.0000 ug | ORAL_TABLET | Freq: Every day | ORAL | 0 refills | Status: DC

## 2020-07-23 ENCOUNTER — Other Ambulatory Visit: Payer: 59

## 2020-07-25 ENCOUNTER — Ambulatory Visit: Payer: 59

## 2020-07-25 ENCOUNTER — Ambulatory Visit: Payer: 59 | Admitting: Oncology

## 2020-09-20 ENCOUNTER — Ambulatory Visit (INDEPENDENT_AMBULATORY_CARE_PROVIDER_SITE_OTHER): Payer: 59 | Admitting: Internal Medicine

## 2020-09-20 ENCOUNTER — Other Ambulatory Visit: Payer: Self-pay

## 2020-09-20 ENCOUNTER — Encounter: Payer: Self-pay | Admitting: Internal Medicine

## 2020-09-20 VITALS — BP 124/72 | HR 97 | Temp 98.3°F | Ht 67.5 in | Wt 234.0 lb

## 2020-09-20 DIAGNOSIS — Z1159 Encounter for screening for other viral diseases: Secondary | ICD-10-CM | POA: Diagnosis not present

## 2020-09-20 DIAGNOSIS — E890 Postprocedural hypothyroidism: Secondary | ICD-10-CM

## 2020-09-20 DIAGNOSIS — Z1231 Encounter for screening mammogram for malignant neoplasm of breast: Secondary | ICD-10-CM

## 2020-09-20 DIAGNOSIS — Z0000 Encounter for general adult medical examination without abnormal findings: Secondary | ICD-10-CM

## 2020-09-20 DIAGNOSIS — R7303 Prediabetes: Secondary | ICD-10-CM

## 2020-09-20 DIAGNOSIS — D509 Iron deficiency anemia, unspecified: Secondary | ICD-10-CM

## 2020-09-20 MED ORDER — ERGOCALCIFEROL 1.25 MG (50000 UT) PO CAPS: 50000.0000 [IU] | ORAL_CAPSULE | ORAL | 1 refills | Status: DC

## 2020-09-20 MED ORDER — LEVOTHYROXINE SODIUM 112 MCG PO TABS: 112.0000 ug | ORAL_TABLET | Freq: Every day | ORAL | 3 refills | Status: DC

## 2020-09-20 NOTE — Progress Notes (Signed)
Date:  09/20/2020   Name:  Kimberly Atkinson   DOB:  04/26/1976   MRN:  [id number]   Chief Complaint: Annual Exam (Need to sign orders for last Mammo and Pap from her GYN.) Kimberly Atkinson is a 45 y.o. female who presents today for her Complete Annual Exam. She feels well. She reports exercising none. She reports she is sleeping well. Breast complaints none.  Mammogram: due Pap smear: GYN - need report Colonoscopy: not due   There is no immunization history on file for this patient.   Thyroid Problem Presents for follow-up visit. Patient reports no anxiety, constipation, diarrhea, fatigue, palpitations or tremors. The symptoms have been stable.  Anemia Presents for follow-up visit. There has been no abdominal pain, bruising/bleeding easily, fever, light-headedness or palpitations. Signs of blood loss that are not present include hematemesis, hematochezia and vaginal bleeding. Compliance problems: referred to Hematology - has not yet followed up.  Compliance with medications is 76-100% (taking iron supplement regularly).    Lab Results  Component Value Date   CREATININE 0.79 12/05/2019   BUN 5 (L) 12/05/2019   NA 136 12/05/2019   K 3.8 12/05/2019   CL 102 12/05/2019   CO2 24 12/05/2019   Lab Results  Component Value Date   CHOL 243 (H) 12/05/2019   HDL 64 12/05/2019   LDLCALC 147 (H) 12/05/2019   TRIG 179 (H) 12/05/2019   CHOLHDL 3.8 12/05/2019   Lab Results  Component Value Date   TSH 1.430 03/27/2020   Lab Results  Component Value Date   HGBA1C 5.9 (H) 12/05/2019   Lab Results  Component Value Date   WBC 7.4 04/26/2020   HGB 10.3 (L) 04/26/2020   HCT 34.3 04/26/2020   MCV 78 (L) 04/26/2020   PLT 351 04/26/2020   Lab Results  Component Value Date   ALT 17 12/05/2019   AST 22 12/05/2019   ALKPHOS 49 12/05/2019   BILITOT 0.2 12/05/2019   Last vitamin D Lab Results  Component Value Date   VD25OH 35.8 03/27/2020   Lab Results  Component Value Date    MGNOIBBC48 8,891 12/05/2019      Review of Systems  Constitutional: Negative for chills, fatigue and fever.  HENT: Negative for congestion, hearing loss, tinnitus, trouble swallowing and voice change.   Eyes: Negative for visual disturbance.  Respiratory: Negative for cough, chest tightness, shortness of breath and wheezing.   Cardiovascular: Negative for chest pain, palpitations and leg swelling.  Gastrointestinal: Negative for abdominal pain, constipation, diarrhea, hematemesis, hematochezia and vomiting.  Endocrine: Negative for polydipsia and polyuria.  Genitourinary: Negative for dysuria, frequency, genital sores, vaginal bleeding and vaginal discharge.  Musculoskeletal: Negative for arthralgias, gait problem and joint swelling.  Skin: Negative for color change and rash.  Neurological: Negative for dizziness, tremors, light-headedness and headaches.  Hematological: Negative for adenopathy. Does not bruise/bleed easily.  Psychiatric/Behavioral: Negative for dysphoric mood and sleep disturbance. The patient is not nervous/anxious.     Patient Active Problem List   Diagnosis Date Noted  . Pre-diabetes 12/05/2019  . Vitamin D deficiency 12/05/2019  . Postablative hypothyroidism 12/05/2019  . Anemia 12/05/2019  . B12 nutritional deficiency 12/05/2019  . Hidradenitis axillaris 11/18/2011    No Known Allergies  Past Surgical History:  Procedure Laterality Date  . BREAST BIOPSY Left 2018   fibroadenoma  . LAPAROSCOPY  08/28/2011   Procedure: LAPAROSCOPY OPERATIVE;  Surgeon: Marvene Staff, MD;  Location: [facility];  Service: Gynecology;  Laterality:  Right;  Fugeration of Endometriosis, Removal of Left Paratubal Cyst./ Chromopertubation    Social History   Tobacco Use  . Smoking status: Never Smoker  . Smokeless tobacco: Never Used  Vaping Use  . Vaping Use: Never used  Substance Use Topics  . Alcohol use: No  . Drug use: No     Medication list has been reviewed and  updated.  Current Meds  Medication Sig  . ergocalciferol (VITAMIN D2) 1.25 MG (50000 UT) capsule Take 1 capsule (50,000 Units total) by mouth once a week.  . FeFum-FePoly-FA-B Cmp-C-Biot (FOLIVANE-PLUS PO) Take 1 capsule by mouth daily.  . folic acid (FOLVITE) 1 MG tablet Take 1 mg by mouth daily.  Marland Kitchen ibuprofen (ADVIL,MOTRIN) 800 MG tablet Take 800 mg by mouth every 8 (eight) hours as needed. menstrual cramps  . levothyroxine (SYNTHROID) 112 MCG tablet Take 1 tablet (112 mcg total) by mouth daily. Takes 112 mcg every day and and 1/2 tablet (21mcg) more on Sunday.  . Multiple Vitamins-Minerals (MULTIVITAMINS THER. W/MINERALS) TABS Take 1 tablet by mouth daily.    PHQ 2/9 Scores 09/20/2020 12/05/2019  PHQ - 2 Score 0 0  PHQ- 9 Score 0 4    GAD 7 : Generalized Anxiety Score 09/20/2020 12/05/2019  Nervous, Anxious, on Edge 0 0  Control/stop worrying 0 0  Worry too much - different things 0 0  Trouble relaxing 0 0  Restless 0 0  Easily annoyed or irritable 0 0  Afraid - awful might happen 0 0  Total GAD 7 Score 0 0  Anxiety Difficulty - Not difficult at all    BP Readings from Last 3 Encounters:  09/20/20 124/72  05/09/20 118/87  03/27/20 122/78    Physical Exam Vitals and nursing note reviewed.  Constitutional:      General: She is not in acute distress.    Appearance: She is well-developed.  HENT:     Head: Normocephalic and atraumatic.     Right Ear: Tympanic membrane and ear canal normal.     Left Ear: Tympanic membrane and ear canal normal.     Nose:     Right Sinus: No maxillary sinus tenderness.     Left Sinus: No maxillary sinus tenderness.  Eyes:     General: No scleral icterus.       Right eye: No discharge.        Left eye: No discharge.     Conjunctiva/sclera: Conjunctivae normal.  Neck:     Thyroid: No thyromegaly.     Vascular: No carotid bruit.  Cardiovascular:     Rate and Rhythm: Normal rate and regular rhythm.     Pulses: Normal pulses.     Heart  sounds: Normal heart sounds.  Pulmonary:     Effort: Pulmonary effort is normal. No respiratory distress.     Breath sounds: No wheezing.  Abdominal:     General: Bowel sounds are normal.     Palpations: Abdomen is soft.     Tenderness: There is no abdominal tenderness.  Musculoskeletal:     Cervical back: Normal range of motion. No erythema.     Right lower leg: No edema.     Left lower leg: No edema.  Lymphadenopathy:     Cervical: No cervical adenopathy.  Skin:    General: Skin is warm and dry.     Findings: No rash.  Neurological:     Mental Status: She is alert and oriented to person, place, and time.  Cranial Nerves: No cranial nerve deficit.     Sensory: No sensory deficit.     Deep Tendon Reflexes: Reflexes are normal and symmetric.  Psychiatric:        Attention and Perception: Attention normal.        Mood and Affect: Mood normal.     Wt Readings from Last 3 Encounters:  09/20/20 234 lb (106.1 kg)  05/09/20 229 lb 11.2 oz (104.2 kg)  03/27/20 233 lb (105.7 kg)    BP 124/72   Pulse 97   Temp 98.3 F (36.8 C) (Oral)   Ht 5' 7.5" (1.715 m)   Wt 234 lb (106.1 kg)   LMP 09/03/2020 (Approximate)   SpO2 100%   BMI 36.11 kg/m   Assessment and Plan: 1. Annual physical exam Exam is normal except for weight. Encourage regular exercise and appropriate dietary changes. - Comprehensive metabolic panel - Lipid panel  2. Need for hepatitis C screening test - Hepatitis C antibody  3. Postablative hypothyroidism Supplemented - check labs and advise - TSH + free T4 - levothyroxine (SYNTHROID) 112 MCG tablet; Take 1 tablet (112 mcg total) by mouth daily. Takes 112 mcg every day and and 1/2 tablet (69mcg) more on Sunday.  Dispense: 102 tablet; Refill: 3  4. Pre-diabetes - Hemoglobin A1c  5. Iron deficiency anemia, unspecified iron deficiency anemia type Pt declined iron infusion in October.  She has been taking a supplement with iron and feels more energetic.   She would like to avoid the infusion if possible. - CBC with Differential/Platelet - Iron, TIBC and Ferritin Panel  6. Encounter for screening mammogram for breast cancer Overdue - last done in [place] but not sure where. - MM 3D SCREEN BREAST BILATERAL; Future   Partially dictated using Editor, commissioning. Any errors are unintentional.  Halina Maidens, MD [facility]  09/20/2020

## 2020-09-21 LAB — COMPREHENSIVE METABOLIC PANEL
ALT: 21 IU/L (ref 0–32)
AST: 23 IU/L (ref 0–40)
Albumin/Globulin Ratio: 1.6 (ref 1.2–2.2)
Albumin: 4.2 g/dL (ref 3.8–4.8)
Alkaline Phosphatase: 49 IU/L (ref 44–121)
BUN/Creatinine Ratio: 8 — ABNORMAL LOW (ref 9–23)
BUN: 6 mg/dL (ref 6–24)
Bilirubin Total: 0.6 mg/dL (ref 0.0–1.2)
CO2: 19 mmol/L — ABNORMAL LOW (ref 20–29)
Calcium: 9.6 mg/dL (ref 8.7–10.2)
Chloride: 102 mmol/L (ref 96–106)
Creatinine, Ser: 0.79 mg/dL (ref 0.57–1.00)
Globulin, Total: 2.7 g/dL (ref 1.5–4.5)
Glucose: 97 mg/dL (ref 65–99)
Potassium: 4.2 mmol/L (ref 3.5–5.2)
Sodium: 136 mmol/L (ref 134–144)
Total Protein: 6.9 g/dL (ref 6.0–8.5)
eGFR: 95 mL/min/{1.73_m2} (ref >59)

## 2020-09-21 LAB — CBC WITH DIFFERENTIAL/PLATELET
Basophils Absolute: 0.1 10*3/uL (ref 0.0–0.2)
Basos: 1 %
EOS (ABSOLUTE): 0.2 10*3/uL (ref 0.0–0.4)
Eos: 2 %
Hematocrit: 35.9 % (ref 34.0–46.6)
Hemoglobin: 11.4 g/dL (ref 11.1–15.9)
Immature Grans (Abs): 0.1 10*3/uL (ref 0.0–0.1)
Immature Granulocytes: 1 %
Lymphocytes Absolute: 3.7 10*3/uL — ABNORMAL HIGH (ref 0.7–3.1)
Lymphs: 37 %
MCH: 24.9 pg — ABNORMAL LOW (ref 26.6–33.0)
MCHC: 31.8 g/dL (ref 31.5–35.7)
MCV: 79 fL (ref 79–97)
Monocytes Absolute: 0.8 10*3/uL (ref 0.1–0.9)
Monocytes: 8 %
Neutrophils Absolute: 5.1 10*3/uL (ref 1.4–7.0)
Neutrophils: 51 %
Platelets: 364 10*3/uL (ref 150–450)
RBC: 4.57 x10E6/uL (ref 3.77–5.28)
RDW: 14.5 % (ref 11.7–15.4)
WBC: 10 10*3/uL (ref 3.4–10.8)

## 2020-09-21 LAB — HEPATITIS C ANTIBODY: Hep C Virus Ab: 0.1 s/co ratio (ref 0.0–0.9)

## 2020-09-21 LAB — IRON,TIBC AND FERRITIN PANEL
Ferritin: 48 ng/mL (ref 15–150)
Iron Saturation: 20 % (ref 15–55)
Iron: 65 ug/dL (ref 27–159)
Total Iron Binding Capacity: 324 ug/dL (ref 250–450)
UIBC: 259 ug/dL (ref 131–425)

## 2020-09-21 LAB — LIPID PANEL
Chol/HDL Ratio: 3.5 ratio (ref 0.0–4.4)
Cholesterol, Total: 185 mg/dL (ref 100–199)
HDL: 53 mg/dL (ref >39)
LDL Chol Calc (NIH): 111 mg/dL — ABNORMAL HIGH (ref 0–99)
Triglycerides: 118 mg/dL (ref 0–149)
VLDL Cholesterol Cal: 21 mg/dL (ref 5–40)

## 2020-09-21 LAB — TSH+FREE T4
Free T4: 1.21 ng/dL (ref 0.82–1.77)
TSH: 0.745 u[IU]/mL (ref 0.450–4.500)

## 2020-09-21 LAB — HEMOGLOBIN A1C
Est. average glucose Bld gHb Est-mCnc: 120 mg/dL
Hgb A1c MFr Bld: 5.8 % — ABNORMAL HIGH (ref 4.8–5.6)

## 2020-09-24 ENCOUNTER — Encounter: Payer: Self-pay | Admitting: Internal Medicine

## 2020-09-24 NOTE — Progress Notes (Signed)
This Pap from 2019 was ASCUS with HPV+.  She should have had a follow up in one year.  The Pap report does not show the MD.  She needs to have this repeated by GYN ASAP.

## 2020-09-25 ENCOUNTER — Telehealth: Payer: Self-pay

## 2020-09-25 NOTE — Telephone Encounter (Signed)
Called patient and left a VM asking her to call back so I can let her know This Pap from 2019 was ASCUS with HPV+.  She should have had a follow up in one year.  The Pap report does not show the MD.  She needs to have this repeated by GYN ASAP.  CRM created for when patient returns the call to the clinic.

## 2020-09-25 NOTE — Telephone Encounter (Signed)
Pt returned Chassidy's call/ please advise

## 2020-09-25 NOTE — Telephone Encounter (Signed)
Called and spoke with patient. Informed of last pap being from 2019 and it showed Ascus with POS HPV. Told her to contact her GYM asap to schedule a pap smear follow up.   She verbalized understanding of this.

## 2020-09-25 NOTE — Telephone Encounter (Signed)
-----   Message from Glean Hess, MD sent at 09/24/2020  4:48 PM EST -----   ----- Message ----- From: Karin Golden Sent: 09/24/2020  10:35 AM EST To: Glean Hess, MD

## 2020-10-09 ENCOUNTER — Telehealth: Payer: Self-pay

## 2020-10-09 NOTE — Telephone Encounter (Unsigned)
Copied from [place] [id number]. Topic: Quick Communication - See Telephone Encounter >> Oct 08, 2020  5:40 PM Loma Boston wrote: CRM for notification. See Telephone encounter for: 10/08/20.Pt had  CPE first of month and was discussed about ordering mammo, please order annual mammo. FU at [phone]

## 2020-10-09 NOTE — Telephone Encounter (Signed)
Patient informed order was placed at her visit and she just needs to call and schedule her mammogram. She said Dr. Army Melia said we would call her and I explained not usually for mammos. She said she will call and get this scheduled.

## 2021-03-14 ENCOUNTER — Other Ambulatory Visit: Payer: Self-pay

## 2021-03-14 DIAGNOSIS — Z1231 Encounter for screening mammogram for malignant neoplasm of breast: Secondary | ICD-10-CM

## 2021-05-28 ENCOUNTER — Other Ambulatory Visit: Payer: Self-pay

## 2021-05-28 ENCOUNTER — Encounter: Payer: Self-pay | Admitting: Internal Medicine

## 2021-05-28 ENCOUNTER — Ambulatory Visit (INDEPENDENT_AMBULATORY_CARE_PROVIDER_SITE_OTHER): Payer: 59 | Admitting: Internal Medicine

## 2021-05-28 VITALS — BP 136/85 | HR 76 | Ht 67.5 in | Wt 238.0 lb

## 2021-05-28 DIAGNOSIS — G43109 Migraine with aura, not intractable, without status migrainosus: Secondary | ICD-10-CM | POA: Diagnosis not present

## 2021-05-28 MED ORDER — ONDANSETRON HCL 4 MG PO TABS: 4.0000 mg | ORAL_TABLET | Freq: Three times a day (TID) | ORAL | 0 refills | Status: DC | PRN

## 2021-05-28 MED ORDER — ERGOCALCIFEROL 1.25 MG (50000 UT) PO CAPS: 50000.0000 [IU] | ORAL_CAPSULE | ORAL | 1 refills | Status: DC

## 2021-05-28 NOTE — Progress Notes (Signed)
Date:  05/28/2021   Name:  Kimberly Atkinson   DOB:  [birth year]   MRN:  [id number]   Chief Complaint: Headache (X 3 months intermittently. Can last any where from hours to days. Tried and failed Sumatriptan, Topamax, and other meds she cannot remember the name of. Seen Neurology for 2 years in [place] up until 2018. )  Headache  This is a recurrent problem. The current episode started more than 1 month ago. The problem occurs daily (3-4 per month lasting 1-4 days). The problem has been waxing and waning. The pain is located in the Right unilateral region. The pain does not radiate. The pain quality is similar to prior headaches. The quality of the pain is described as pulsating. The pain is mild. Associated symptoms include blurred vision, dizziness, nausea, photophobia and a visual change. Pertinent negatives include no fever, numbness or weakness. She has tried nothing for the symptoms.   Lab Results  Component Value Date   CREATININE 0.79 09/20/2020   BUN 6 09/20/2020   NA 136 09/20/2020   K 4.2 09/20/2020   CL 102 09/20/2020   CO2 19 (L) 09/20/2020   Lab Results  Component Value Date   CHOL 185 09/20/2020   HDL 53 09/20/2020   LDLCALC 111 (H) 09/20/2020   TRIG 118 09/20/2020   CHOLHDL 3.5 09/20/2020   Lab Results  Component Value Date   TSH 0.745 09/20/2020   Lab Results  Component Value Date   HGBA1C 5.8 (H) 09/20/2020   Lab Results  Component Value Date   WBC 10.0 09/20/2020   HGB 11.4 09/20/2020   HCT 35.9 09/20/2020   MCV 79 09/20/2020   PLT 364 09/20/2020   Lab Results  Component Value Date   ALT 21 09/20/2020   AST 23 09/20/2020   ALKPHOS 49 09/20/2020   BILITOT 0.6 09/20/2020     Review of Systems  Constitutional:  Negative for fever.  Eyes:  Positive for blurred vision and photophobia.  Respiratory:  Negative for chest tightness and shortness of breath.   Cardiovascular:  Negative for chest pain and palpitations.  Gastrointestinal:  Positive  for nausea.  Neurological:  Positive for dizziness, light-headedness and headaches. Negative for syncope, weakness and numbness.  Psychiatric/Behavioral:  Negative for dysphoric mood and sleep disturbance. The patient is not nervous/anxious.    Patient Active Problem List   Diagnosis Date Noted   Migraine with aura and without status migrainosus, not intractable 05/28/2021   Pre-diabetes 12/05/2019   Vitamin D deficiency 12/05/2019   Postablative hypothyroidism 12/05/2019   Anemia 12/05/2019   B12 nutritional deficiency 12/05/2019   Hidradenitis axillaris 11/18/2011    No Known Allergies  Past Surgical History:  Procedure Laterality Date   BREAST BIOPSY Left 2018   fibroadenoma   LAPAROSCOPY  08/28/2011   Procedure: LAPAROSCOPY OPERATIVE;  Surgeon: Marvene Staff, MD;  Location: [facility];  Service: Gynecology;  Laterality: Right;  Fugeration of Endometriosis, Removal of Left Paratubal Cyst./ Chromopertubation    Social History   Tobacco Use   Smoking status: Never   Smokeless tobacco: Never  Vaping Use   Vaping Use: Never used  Substance Use Topics   Alcohol use: No   Drug use: No     Medication list has been reviewed and updated.  Current Meds  Medication Sig   cyanocobalamin 100 MCG tablet Take 100 mcg by mouth daily.   FeFum-FePoly-FA-B Cmp-C-Biot (FOLIVANE-PLUS PO) Take 1 capsule by mouth daily.   folic  acid (FOLVITE) 1 MG tablet Take 1 mg by mouth daily.   ibuprofen (ADVIL,MOTRIN) 800 MG tablet Take 800 mg by mouth every 8 (eight) hours as needed. menstrual cramps   levothyroxine (SYNTHROID) 112 MCG tablet Take 1 tablet (112 mcg total) by mouth daily. Takes 112 mcg every day and and 1/2 tablet (59mcg) more on Sunday.   Multiple Vitamins-Minerals (MULTIVITAMINS THER. W/MINERALS) TABS Take 1 tablet by mouth daily.   ondansetron (ZOFRAN) 4 MG tablet Take 1 tablet (4 mg total) by mouth every 8 (eight) hours as needed for nausea or vomiting.   [DISCONTINUED]  ergocalciferol (VITAMIN D2) 1.25 MG (50000 UT) capsule Take 1 capsule (50,000 Units total) by mouth once a week.    PHQ 2/9 Scores 05/28/2021 09/20/2020 12/05/2019  PHQ - 2 Score 0 0 0  PHQ- 9 Score 0 0 4    GAD 7 : Generalized Anxiety Score 05/28/2021 09/20/2020 12/05/2019  Nervous, Anxious, on Edge 0 0 0  Control/stop worrying 0 0 0  Worry too much - different things 0 0 0  Trouble relaxing 0 0 0  Restless 0 0 0  Easily annoyed or irritable 0 0 0  Afraid - awful might happen 0 0 0  Total GAD 7 Score 0 0 0  Anxiety Difficulty Not difficult at all - Not difficult at all    BP Readings from Last 3 Encounters:  05/28/21 136/85  09/20/20 124/72  05/09/20 118/87    Physical Exam Vitals and nursing note reviewed.  Constitutional:      General: She is not in acute distress.    Appearance: She is well-developed.  HENT:     Head: Normocephalic and atraumatic.  Eyes:     Extraocular Movements: Extraocular movements intact.  Cardiovascular:     Rate and Rhythm: Normal rate and regular rhythm.     Heart sounds: Normal heart sounds.  Pulmonary:     Effort: Pulmonary effort is normal. No respiratory distress.     Breath sounds: No wheezing or rhonchi.  Musculoskeletal:     Cervical back: Normal range of motion and neck supple.  Lymphadenopathy:     Cervical: No cervical adenopathy.  Skin:    General: Skin is warm and dry.     Findings: No rash.  Neurological:     Mental Status: She is alert and oriented to person, place, and time.  Psychiatric:        Mood and Affect: Mood normal.        Behavior: Behavior normal.    Wt Readings from Last 3 Encounters:  05/28/21 238 lb (108 kg)  09/20/20 234 lb (106.1 kg)  05/09/20 229 lb 11.2 oz (104.2 kg)    BP 136/85   Pulse 76   Ht 5' 7.5" (1.715 m)   Wt 238 lb (108 kg)   SpO2 99%   BMI 36.73 kg/m   Assessment and Plan: 1. Migraine with aura and without status migrainosus, not intractable She has failed Topamax and triptans in  the past. Samples of Nurtec 75 mg #4 given with coupon Will complete FMLA paper work once received. - ondansetron (ZOFRAN) 4 MG tablet; Take 1 tablet (4 mg total) by mouth every 8 (eight) hours as needed for nausea or vomiting.  Dispense: 20 tablet; Refill: 0   Partially dictated using Editor, commissioning. Any errors are unintentional.  Halina Maidens, MD [facility]  05/28/2021

## 2021-05-30 ENCOUNTER — Other Ambulatory Visit: Payer: Self-pay

## 2021-05-30 ENCOUNTER — Telehealth: Payer: Self-pay | Admitting: Internal Medicine

## 2021-05-30 MED ORDER — NURTEC 75 MG PO TBDP: 1.0000 | ORAL_TABLET | ORAL | 5 refills | Status: DC

## 2021-05-30 NOTE — Telephone Encounter (Signed)
Completed PA for Nurtec 75 mg - 16 tablets for 30 days.   NURTEC TAB 75MG  ODT is approved through 05/30/2022  Patient can take this every other day for preventative treatment. Patient informed and medication sent to pharmacy.

## 2021-05-30 NOTE — Telephone Encounter (Signed)
Patient states PA is required for Nurtec. Patient states she can tolerate Nurtec and requesting additional samples.

## 2021-06-05 ENCOUNTER — Telehealth: Payer: Self-pay

## 2021-06-05 ENCOUNTER — Other Ambulatory Visit: Payer: Self-pay

## 2021-06-05 DIAGNOSIS — G43109 Migraine with aura, not intractable, without status migrainosus: Secondary | ICD-10-CM

## 2021-06-05 NOTE — Telephone Encounter (Signed)
Copied from [place] [id number]. Topic: General - Other >> Jun 05, 2021  2:57 PM Valere Dross wrote: Reason for CRM: Pt called in stating she wanted to speak with PCP nurse, but she did not want to specify about what, please advise.

## 2021-06-05 NOTE — Telephone Encounter (Signed)
Spoke with patient and she would like a referral to a migraine specialist. Placed referral for [facility] at [place]. Patient informed.

## 2021-06-06 ENCOUNTER — Encounter: Payer: Self-pay | Admitting: Neurology

## 2021-06-10 ENCOUNTER — Telehealth: Payer: Self-pay

## 2021-06-10 ENCOUNTER — Other Ambulatory Visit: Payer: Self-pay

## 2021-06-10 DIAGNOSIS — G43109 Migraine with aura, not intractable, without status migrainosus: Secondary | ICD-10-CM

## 2021-06-10 MED ORDER — RIZATRIPTAN BENZOATE 10 MG PO TBDP: 10.0000 mg | ORAL_TABLET | ORAL | 0 refills | Status: DC | PRN

## 2021-06-10 NOTE — Telephone Encounter (Signed)
Spoke with pt. Placed new referral to Neuro at [facility]. Patient will take maxalt PRN in between nurtec as needed until seeing neurology. Pt will need intermittent FMLA for migraines until this is controlled.

## 2021-06-10 NOTE — Telephone Encounter (Signed)
Copied from [place] [id number]. Topic: General - Other >> Jun 10, 2021  2:59 PM Leward Quan A wrote: Reason for CRM: Patient called in to inform Dr Army Melia nurse that the Neurologist that she was referred to does not have any openings for three months. Will not be able to see her till February 2023 so want to be referred to [facility]. Also have some concerns that was already discussed with Dr Army Melia that is still concerning please call  Ph# [phone]

## 2021-06-28 ENCOUNTER — Telehealth: Payer: Self-pay

## 2021-06-28 NOTE — Telephone Encounter (Signed)
Copied from [place] [id number]. Topic: General - Other >> Jun 28, 2021 11:46 AM Leward Quan A wrote: Reason for CRM: Patient called in to speak to Dr Army Melia nurse to  check status of FMLA paper work sent over by her job this week since there is a deadline want to make sure it was received and is being completed. Please call patient at  Ph# [phone]

## 2021-06-28 NOTE — Telephone Encounter (Signed)
Spoke to pt let her know that we have no received the paperwork for FMLA. Pt will get job to refax information.  KP

## 2021-07-01 ENCOUNTER — Telehealth: Payer: Self-pay

## 2021-07-01 NOTE — Telephone Encounter (Signed)
Called pt left VM that we have received the [facility] paperwork. Stated that the paperwork does need her signature that she would have to come to the office to sign it.  PEC nurse may give results to patient if they return call to clinic, a CRM has been created.    KP

## 2021-07-01 NOTE — Telephone Encounter (Signed)
Called pt to let her know that her FMLA paperwork was completed and ready for pick up. Need signature from pt before we can fax it back. Pt verbalized understanding.  KP

## 2021-07-30 NOTE — Progress Notes (Signed)
NEUROLOGY CONSULTATION NOTE  Kimberly Atkinson MRN: [id number] DOB: 30-Jun-1976  Referring provider: Halina Maidens, MD Primary care provider: Halina Maidens, MD  Reason for consult:  headache  Assessment/Plan:   Migraine without aura, without status migrainosus, not intractable  Migraine prevention:  Nurtec effective but causing severe constipation.  Due to severe constipation, will switch from Nurtec to Ajovy.  If still with severe constipation, then would consider nortriptyline Migraine rescue:  rizatriptan and Zofran Limit use of pain relievers to no more than 2 days out of week to prevent risk of rebound or medication-overuse headache. Keep headache diary Follow up 6 months.    Subjective:  Kimberly Atkinson is a 46 year old right-handed female with DM II and hypothyroidism who presents for headache.  History supplemented by referring provider's note.  Onset:  First onset migraines in her early 42s but then resolved.  Restarted June 2022.  Becoming worse. Location:  right hemispheric - may radiated down back of head, neck and into back Quality:  pounding Intensity:  5-6/10.   Aura:  absent Prodrome:  absent Associated symptoms:  Nausea, photophobia, phonophobia, dizziness, sometimes blurred vision, right eye twitches.  She denies associated vomiting, unilateral numbness or weakness. Duration:  2 to 3 days, since starting Nurtec last 1 day Frequency:  2 to 3 times a month, since starting Nurtec she has had 2 migraines in past 2 months. Triggers:  stress Relieving factors:  rest Activity:  aggravates  MRI and MRA of brain from March 2015 personally reviewed were unremarkable.  Current NSAIDS/analgesics:  ibuprofen 800mg  (for menstrual cramps) Current triptans:  Maxalt-MLT 10mg  (has not tried it yet) Current ergotamine:  none Current anti-emetic:  Zofran 4mg  Current muscle relaxants:  none Current Antihypertensive medications:  none Current Antidepressant medications:   none Current Anticonvulsant medications:  none Current anti-CGRP:  Nurtec QOD (started in November 2022) Current Vitamins/Herbal/Supplements:  U31, D2, folic acid, MVI Current Antihistamines/Decongestants:  none Other therapy:  none Hormone/birth control:  none Nurtec is causing nausea and severe constipation.  Uses stool softener daily.     Past NSAIDS/analgesics:  Tylenol, Excedrin, Lodine  isomethept Past abortive triptans:  sumatriptan 50mg  Past abortive ergotamine:  none Past muscle relaxants:  none Past anti-emetic:  none Past antihypertensive medications:  verapamil Past antidepressant medications:  maybe venlafaxine Past anticonvulsant medications:  topiramate Past anti-CGRP:  none Past vitamins/Herbal/Supplements:  S97, D2, folic acid Past antihistamines/decongestants:  none Other past therapies:  none  Caffeine:  no longer drinks coffee.   Diet:  Clear soda.  Hydrates. Exercise:  no Depression:  mild; Anxiety:  work-related stress Other pain:  no Sleep hygiene:  okay Stressful job.  Works in Therapist, art.  Takes escalated manager calls. Family History of Migraines:  No     PAST MEDICAL HISTORY: Past Medical History:  Diagnosis Date   Anemia    Takes iron and folic acid   Anxiety    Diabetes mellitus without complication (HCC)    Pre- diabetic   GERD (gastroesophageal reflux disease)    no meds at present   Headache    Hypothyroidism    Insomnia    Piriformis syndrome    Situational depression    Vitamin B deficiency    Vitamin D deficiency     PAST SURGICAL HISTORY: Past Surgical History:  Procedure Laterality Date   BREAST BIOPSY Left 2018   fibroadenoma   LAPAROSCOPY  08/28/2011   Procedure: LAPAROSCOPY OPERATIVE;  Surgeon: Marvene Staff, MD;  Location: [facility];  Service: Gynecology;  Laterality: Right;  Fugeration of Endometriosis, Removal of Left Paratubal Cyst./ Chromopertubation    MEDICATIONS: Current Outpatient Medications on  File Prior to Visit  Medication Sig Dispense Refill   cyanocobalamin 100 MCG tablet Take 100 mcg by mouth daily.     ergocalciferol (VITAMIN D2) 1.25 MG (50000 UT) capsule Take 1 capsule (50,000 Units total) by mouth once a week. 12 capsule 1   FeFum-FePoly-FA-B Cmp-C-Biot (FOLIVANE-PLUS PO) Take 1 capsule by mouth daily.     folic acid (FOLVITE) 1 MG tablet Take 1 mg by mouth daily.     ibuprofen (ADVIL,MOTRIN) 800 MG tablet Take 800 mg by mouth every 8 (eight) hours as needed. menstrual cramps     levothyroxine (SYNTHROID) 112 MCG tablet Take 1 tablet (112 mcg total) by mouth daily. Takes 112 mcg every day and and 1/2 tablet (67mcg) more on Sunday. 102 tablet 3   Multiple Vitamins-Minerals (MULTIVITAMINS THER. W/MINERALS) TABS Take 1 tablet by mouth daily.     ondansetron (ZOFRAN) 4 MG tablet Take 1 tablet (4 mg total) by mouth every 8 (eight) hours as needed for nausea or vomiting. 20 tablet 0   Rimegepant Sulfate (NURTEC) 75 MG TBDP Take 1 tablet by mouth every other day. 16 tablet 5   rizatriptan (MAXALT-MLT) 10 MG disintegrating tablet Take 1 tablet (10 mg total) by mouth as needed for migraine. May repeat in 2 hours if needed. Do not exceed more than 2 tablets in a 24 hour period. 10 tablet 0   No current facility-administered medications on file prior to visit.    ALLERGIES: No Known Allergies  FAMILY HISTORY: Family History  Problem Relation Age of Onset   Diabetes Mother    Hypertension Mother    Hyperlipidemia Mother     Objective:  Blood pressure (!) 145/82, pulse 95, height 5\' 7"  (1.702 m), weight 234 lb 3.2 oz (106.2 kg), SpO2 100 %. General: No acute distress.  Patient appears well-groomed.   Head:  Normocephalic/atraumatic Eyes:  fundi examined but not visualized Neck: supple, no paraspinal tenderness, full range of motion Back: No paraspinal tenderness Heart: regular rate and rhythm Lungs: Clear to auscultation bilaterally. Vascular: No carotid  bruits. Neurological Exam: Mental status: alert and oriented to person, place, and time, recent and remote memory intact, fund of knowledge intact, attention and concentration intact, speech fluent and not dysarthric, language intact. Cranial nerves: CN I: not tested CN II: pupils equal, round and reactive to light, visual fields intact CN III, IV, VI:  full range of motion, no nystagmus, no ptosis CN V: facial sensation intact. CN VII: upper and lower face symmetric CN VIII: hearing intact CN IX, X: gag intact, uvula midline CN XI: sternocleidomastoid and trapezius muscles intact CN XII: tongue midline Bulk & Tone: normal, no fasciculations. Motor:  muscle strength 5/5 throughout Sensation:  Pinprick, temperature and vibratory sensation intact. Deep Tendon Reflexes:  2+ throughout,  toes downgoing.   Finger to nose testing:  Without dysmetria.   Heel to shin:  Without dysmetria.   Gait:  Normal station and stride.  Romberg negative.    Thank you for allowing me to take part in the care of this patient.  Metta Clines, DO  CC: Halina Maidens, MD

## 2021-07-31 ENCOUNTER — Encounter: Payer: Self-pay | Admitting: Neurology

## 2021-07-31 ENCOUNTER — Other Ambulatory Visit: Payer: Self-pay

## 2021-07-31 ENCOUNTER — Ambulatory Visit (INDEPENDENT_AMBULATORY_CARE_PROVIDER_SITE_OTHER): Payer: 59 | Admitting: Neurology

## 2021-07-31 VITALS — BP 145/82 | HR 95 | Ht 67.0 in | Wt 234.2 lb

## 2021-07-31 DIAGNOSIS — G43009 Migraine without aura, not intractable, without status migrainosus: Secondary | ICD-10-CM

## 2021-07-31 MED ORDER — AJOVY 225 MG/1.5ML ~~LOC~~ SOAJ: 225.0000 mg | SUBCUTANEOUS | 5 refills | Status: DC

## 2021-07-31 NOTE — Patient Instructions (Signed)
°  Plan is to stop Nurtec and start Ajovy injection every 28 days Take rizatriptan 10mg  at earliest onset of headache.  May repeat dose once in 2 hours if needed.  Maximum 2 tablets in 24 hours. Take ondansetron for nausea Limit use of pain relievers to no more than 2 days out of the week.  These medications include acetaminophen, NSAIDs (ibuprofen/Advil/Motrin, naproxen/Aleve, triptans (Imitrex/sumatriptan), Excedrin, and narcotics.  This will help reduce risk of rebound headaches. Be aware of common food triggers:  - Caffeine:  coffee, black tea, cola, Mt. Dew  - Chocolate  - Dairy:  aged cheeses (brie, blue, cheddar, gouda, [place], provolone, [place], Swiss, etc), chocolate milk, buttermilk, sour cream, limit eggs and yogurt  - Nuts, peanut butter  - Alcohol  - Cereals/grains:  FRESH breads (fresh bagels, sourdough, doughnuts), yeast productions  - Processed/canned/aged/cured meats (pre-packaged deli meats, hotdogs)  - MSG/glutamate:  soy sauce, flavor enhancer, pickled/preserved/marinated foods  - Sweeteners:  aspartame (Equal, Nutrasweet).  Sugar and Splenda are okay  - Vegetables:  legumes (lima beans, lentils, snow peas, fava beans, pinto peans, peas, garbanzo beans), sauerkraut, onions, olives, pickles  - Fruit:  avocados, bananas, citrus fruit (orange, lemon, grapefruit), mango  - Other:  Frozen meals, macaroni and cheese Routine exercise Stay adequately hydrated (aim for 64 oz water daily) Keep headache diary Maintain proper stress management Maintain proper sleep hygiene Do not skip meals Consider supplements:  magnesium citrate 400mg  daily, riboflavin 400mg  daily, coenzyme Q10 100mg  three times daily.

## 2021-08-01 ENCOUNTER — Telehealth: Payer: Self-pay

## 2021-08-01 NOTE — Telephone Encounter (Signed)
New message    Your information has been sent to [facility].  Brittony Rud KeyNatale Lay - PA Case ID: [id number] - Rx #: [id number] Need help? Call us at [phone] Status Sent to [place] (fremanezumab-vfrm) injection 225MG /1.5ML auto-injectors Form [facility] Electronic Prior Authorization Form (2017 NCPDP) Original Claim Info 70 Use Alt or PA REQ'D Call [phone]-Formulary DrugPackaging Exception Not CoveredNon-Formulary [id number]:

## 2021-08-02 ENCOUNTER — Telehealth: Payer: Self-pay | Admitting: Internal Medicine

## 2021-08-02 ENCOUNTER — Other Ambulatory Visit: Payer: Self-pay

## 2021-08-02 DIAGNOSIS — G43109 Migraine with aura, not intractable, without status migrainosus: Secondary | ICD-10-CM

## 2021-08-02 MED ORDER — NURTEC 75 MG PO TBDP: 1.0000 | ORAL_TABLET | ORAL | 3 refills | Status: DC

## 2021-08-02 NOTE — Telephone Encounter (Signed)
Pt wants to follow up with nurse about side effects that she's having from taking Rimegepant Sulfate (NURTEC) 75 MG TBDP

## 2021-08-02 NOTE — Telephone Encounter (Signed)
Spoke to pt she is experiencing severe constipation let pt know to take colace OTC 100 MG daily. Pt verbalized understanding.

## 2021-08-08 NOTE — Telephone Encounter (Signed)
Kimberly Atkinson KeyNatale Lay - PA Case ID: [id number] - Rx #: [id number] Need help? Call us at [phone] Outcome Deniedon January 12 Request Reference Number: [id number]. AJOVY INJ 225/1.5 is denied for not meeting the prior authorization requirement(s). Details of this decision are in the notice attached below or have been faxed to you. Drug AJOVY (fremanezumab-vfrm) injection 225MG /1.5ML auto-injectors Form [facility] Electronic Prior Authorization Form (2017 NCPDP) Original Claim Info 70 Use Alt or PA REQ'D Call [phone]-Formulary DrugPackaging Exception Not CoveredNon-Formulary [id number]:

## 2021-08-08 NOTE — Telephone Encounter (Signed)
F/u  Fax clinical notes to [facility] at [phone]

## 2021-08-16 ENCOUNTER — Ambulatory Visit (INDEPENDENT_AMBULATORY_CARE_PROVIDER_SITE_OTHER): Payer: 59 | Admitting: Internal Medicine

## 2021-08-16 ENCOUNTER — Encounter: Payer: Self-pay | Admitting: Internal Medicine

## 2021-08-16 ENCOUNTER — Other Ambulatory Visit: Payer: Self-pay

## 2021-08-16 VITALS — BP 122/82 | HR 93 | Temp 99.5°F | Ht 67.5 in | Wt 236.0 lb

## 2021-08-16 DIAGNOSIS — G43109 Migraine with aura, not intractable, without status migrainosus: Secondary | ICD-10-CM

## 2021-08-16 DIAGNOSIS — H6983 Other specified disorders of Eustachian tube, bilateral: Secondary | ICD-10-CM

## 2021-08-16 NOTE — Progress Notes (Signed)
Date:  08/16/2021   Name:  Kimberly Atkinson   DOB:  02-19-1976   MRN:  [id number]   Chief Complaint: Ear Drainage (6 weeks, both ears,on and off, painful, feels like its clogged, cant hear ) and Eye Problem (6 weeks, Right, no fever, watery drainage, )  Ear Drainage  There is pain in both ears. This is a new problem. Episode onset: 6 weeks. The problem occurs every few hours. The problem has been gradually worsening. There has been no fever (99.5). The pain is at a severity of 1/10. The pain is mild. Associated symptoms include headaches and hearing loss. Pertinent negatives include no coughing or ear discharge. She has tried ear drops (OTC medication Sudafed and zinc) for the symptoms. The treatment provided no relief.  Eye Problem  The right (tearing when lying on right side at night) eye is affected. This is a new problem. Episode onset: 6 weeks. The problem occurs daily. The problem has been unchanged. There was no injury mechanism. The pain is at a severity of 0/10. The patient is experiencing no pain. There is No known exposure to pink eye. She Wears contacts (weares every 3 months 1-2 times). Associated symptoms include an eye discharge (clear drainage when lying down). Pertinent negatives include no fever.  Migraine  This is a recurrent problem. The problem has been gradually improving. The pain quality is similar to prior headaches. Associated symptoms include hearing loss. Pertinent negatives include no coughing, dizziness, eye pain, fever, sinus pressure or tinnitus.   Lab Results  Component Value Date   NA 136 09/20/2020   K 4.2 09/20/2020   CO2 19 (L) 09/20/2020   GLUCOSE 97 09/20/2020   BUN 6 09/20/2020   CREATININE 0.79 09/20/2020   CALCIUM 9.6 09/20/2020   EGFR 95 09/20/2020   GFRNONAA 92 12/05/2019   Lab Results  Component Value Date   CHOL 185 09/20/2020   HDL 53 09/20/2020   LDLCALC 111 (H) 09/20/2020   TRIG 118 09/20/2020   CHOLHDL 3.5 09/20/2020   Lab Results   Component Value Date   TSH 0.745 09/20/2020   Lab Results  Component Value Date   HGBA1C 5.8 (H) 09/20/2020   Lab Results  Component Value Date   WBC 10.0 09/20/2020   HGB 11.4 09/20/2020   HCT 35.9 09/20/2020   MCV 79 09/20/2020   PLT 364 09/20/2020   Lab Results  Component Value Date   ALT 21 09/20/2020   AST 23 09/20/2020   ALKPHOS 49 09/20/2020   BILITOT 0.6 09/20/2020   Lab Results  Component Value Date   VD25OH 35.8 03/27/2020     Review of Systems  Constitutional:  Negative for chills, fatigue and fever.  HENT:  Positive for hearing loss and postnasal drip. Negative for congestion, ear discharge, sinus pressure, tinnitus and trouble swallowing.   Eyes:  Positive for discharge (clear drainage when lying down). Negative for pain.  Respiratory:  Negative for cough, chest tightness and shortness of breath.   Cardiovascular:  Negative for chest pain.  Neurological:  Positive for headaches. Negative for dizziness and light-headedness.   Patient Active Problem List   Diagnosis Date Noted   Migraine with aura and without status migrainosus, not intractable 05/28/2021   Pre-diabetes 12/05/2019   Vitamin D deficiency 12/05/2019   Postablative hypothyroidism 12/05/2019   Anemia 12/05/2019   B12 nutritional deficiency 12/05/2019   Hidradenitis axillaris 11/18/2011    No Known Allergies  Past Surgical History:  Procedure  Laterality Date   BREAST BIOPSY Left 2018   fibroadenoma   LAPAROSCOPY  08/28/2011   Procedure: LAPAROSCOPY OPERATIVE;  Surgeon: Marvene Staff, MD;  Location: [facility];  Service: Gynecology;  Laterality: Right;  Fugeration of Endometriosis, Removal of Left Paratubal Cyst./ Chromopertubation    Social History   Tobacco Use   Smoking status: Never   Smokeless tobacco: Never  Vaping Use   Vaping Use: Never used  Substance Use Topics   Alcohol use: No   Drug use: No     Medication list has been reviewed and updated.  Current Meds   Medication Sig   cyanocobalamin 100 MCG tablet Take 100 mcg by mouth daily.   ergocalciferol (VITAMIN D2) 1.25 MG (50000 UT) capsule Take 1 capsule (50,000 Units total) by mouth once a week.   FeFum-FePoly-FA-B Cmp-C-Biot (FOLIVANE-PLUS PO) Take 1 capsule by mouth daily.   folic acid (FOLVITE) 1 MG tablet Take 1 mg by mouth daily.   ibuprofen (ADVIL,MOTRIN) 800 MG tablet Take 800 mg by mouth every 8 (eight) hours as needed. menstrual cramps   levothyroxine (SYNTHROID) 112 MCG tablet Take 1 tablet (112 mcg total) by mouth daily. Takes 112 mcg every day and and 1/2 tablet (83mg) more on Sunday.   Multiple Vitamins-Minerals (MULTIVITAMINS THER. W/MINERALS) TABS Take 1 tablet by mouth daily.   ondansetron (ZOFRAN) 4 MG tablet Take 1 tablet (4 mg total) by mouth every 8 (eight) hours as needed for nausea or vomiting.   Rimegepant Sulfate (NURTEC) 75 MG TBDP Take 1 tablet by mouth every other day.   rizatriptan (MAXALT-MLT) 10 MG disintegrating tablet Take 1 tablet (10 mg total) by mouth as needed for migraine. May repeat in 2 hours if needed. Do not exceed more than 2 tablets in a 24 hour period.    PHQ 2/9 Scores 08/16/2021 05/28/2021 09/20/2020 12/05/2019  PHQ - 2 Score 0 0 0 0  PHQ- 9 Score 0 0 0 4    GAD 7 : Generalized Anxiety Score 08/16/2021 05/28/2021 09/20/2020 12/05/2019  Nervous, Anxious, on Edge 0 0 0 0  Control/stop worrying 0 0 0 0  Worry too much - different things 0 0 0 0  Trouble relaxing 0 0 0 0  Restless 0 0 0 0  Easily annoyed or irritable 0 0 0 0  Afraid - awful might happen 0 0 0 0  Total GAD 7 Score 0 0 0 0  Anxiety Difficulty - Not difficult at all - Not difficult at all    BP Readings from Last 3 Encounters:  08/16/21 122/82  08/01/21 (!) 145/82  05/28/21 136/85    Physical Exam Vitals and nursing note reviewed.  Constitutional:      General: She is not in acute distress.    Appearance: Normal appearance. She is well-developed.  HENT:     Head: Normocephalic and  atraumatic.     Right Ear: Ear canal normal. No tenderness. No middle ear effusion. Tympanic membrane is retracted. Tympanic membrane is not erythematous.     Left Ear: Ear canal normal. No tenderness.  No middle ear effusion. Tympanic membrane is retracted. Tympanic membrane is not erythematous.     Nose:     Right Sinus: No maxillary sinus tenderness or frontal sinus tenderness.     Left Sinus: No maxillary sinus tenderness or frontal sinus tenderness.     Mouth/Throat:     Pharynx: Oropharynx is clear.  Eyes:     General: Lids are normal.  Extraocular Movements: Extraocular movements intact.     Conjunctiva/sclera: Conjunctivae normal.  Cardiovascular:     Rate and Rhythm: Normal rate and regular rhythm.  Pulmonary:     Effort: Pulmonary effort is normal. No respiratory distress.     Breath sounds: No wheezing or rhonchi.  Musculoskeletal:     Cervical back: Normal range of motion.  Lymphadenopathy:     Cervical: No cervical adenopathy.  Skin:    General: Skin is warm and dry.     Findings: No rash.  Neurological:     Mental Status: She is alert and oriented to person, place, and time.  Psychiatric:        Mood and Affect: Mood normal.        Behavior: Behavior normal.    Wt Readings from Last 3 Encounters:  08/16/21 236 lb (107 kg)  08/01/21 234 lb 3.2 oz (106.2 kg)  05/28/21 238 lb (108 kg)    BP 122/82    Pulse 93    Temp 99.5 F (37.5 C) (Oral)    Ht 5' 7.5" (1.715 m)    Wt 236 lb (107 kg)    SpO2 99%    BMI 36.42 kg/m   Assessment and Plan: 1. Eustachian tube dysfunction, bilateral Recommend Allegra-D daily Consider adding Flonase if sx persist No evidence of bacterial infection/eye infection  2. Migraine with aura and without status migrainosus, not intractable Improved on Nurtec qod Mild constipation managed with Colace PRN Continue Maxalt if needed   Partially dictated using Editor, commissioning. Any errors are unintentional.  Halina Maidens, MD [facility]  08/16/2021

## 2021-08-16 NOTE — Patient Instructions (Signed)
Take Allegra-D every day for several weeks

## 2021-08-30 ENCOUNTER — Telehealth: Payer: Self-pay | Admitting: Neurology

## 2021-08-30 ENCOUNTER — Encounter: Payer: Self-pay | Admitting: Neurology

## 2021-08-30 NOTE — Telephone Encounter (Signed)
Patient seen in initial consultation in January.  She has since established care with another neurology practice.  Therefore, she will be discharged from our practice.

## 2021-09-03 ENCOUNTER — Ambulatory Visit: Payer: 59 | Admitting: Neurology

## 2021-09-04 ENCOUNTER — Telehealth: Payer: Self-pay | Admitting: Neurology

## 2021-09-04 NOTE — Telephone Encounter (Signed)
Patient dismissed from [facility] by Metta Clines, DO, effective 08/23/21. Dismissal Letter sent out by 1st class mail. [employer]

## 2021-09-23 ENCOUNTER — Other Ambulatory Visit: Payer: Self-pay | Admitting: Internal Medicine

## 2021-09-23 DIAGNOSIS — E890 Postprocedural hypothyroidism: Secondary | ICD-10-CM

## 2021-09-24 NOTE — Telephone Encounter (Signed)
Requested medication (s) are due for refill today: yes ? ?Requested medication (s) are on the active medication list: yes ? ?Last refill:  09/20/20 #102/3 ? ?Future visit scheduled: yes ? ?Notes to clinic:  Unable to refill per protocol due to failed labs, no updated results. ? ? ? ?  ?Requested Prescriptions  ?Pending Prescriptions Disp Refills  ? levothyroxine (SYNTHROID) 112 MCG tablet [Pharmacy Med Name: levothyroxine 112 mcg tablet] 102 tablet 3  ?  Sig: Take 1 tablet (112 mcg total) by mouth daily. Takes 112 mcg every day and and 1/2 tablet (39mg) more on Sunday.  ?  ? Endocrinology:  Hypothyroid Agents Failed - 09/23/2021 11:17 AM  ?  ?  Failed - TSH in normal range and within 360 days  ?  TSH  ?Date Value Ref Range Status  ?09/20/2020 0.745 0.450 - 4.500 uIU/mL Final  ?  ?  ?  ?  Passed - Valid encounter within last 12 months  ?  Recent Outpatient Visits   ? ?      ? 1 month ago [facility]  [facility]  ? 3 months ago Migraine with aura and without status migrainosus, not intractable  [facility]  ? 1 [facility]  ? 1 year [facility]abl[facility]othy[facility]  [facility]  ? 1 year [facility]  [facility]  ? ?  ?  ?Future Appointments   ? ?        ? In 6 days [facility] ? ?  ? ?  ?  ?  ? ?

## 2021-09-26 ENCOUNTER — Encounter: Payer: 59 | Admitting: Internal Medicine

## 2021-09-27 ENCOUNTER — Other Ambulatory Visit: Payer: Self-pay | Admitting: Internal Medicine

## 2021-09-30 ENCOUNTER — Ambulatory Visit (INDEPENDENT_AMBULATORY_CARE_PROVIDER_SITE_OTHER): Payer: 59 | Admitting: Internal Medicine

## 2021-09-30 ENCOUNTER — Other Ambulatory Visit: Payer: Self-pay

## 2021-09-30 ENCOUNTER — Encounter: Payer: Self-pay | Admitting: Internal Medicine

## 2021-09-30 VITALS — BP 118/74 | HR 80 | Ht 67.5 in | Wt 234.0 lb

## 2021-09-30 DIAGNOSIS — Z1211 Encounter for screening for malignant neoplasm of colon: Secondary | ICD-10-CM

## 2021-09-30 DIAGNOSIS — G43109 Migraine with aura, not intractable, without status migrainosus: Secondary | ICD-10-CM | POA: Diagnosis not present

## 2021-09-30 DIAGNOSIS — E559 Vitamin D deficiency, unspecified: Secondary | ICD-10-CM | POA: Diagnosis not present

## 2021-09-30 DIAGNOSIS — Z0000 Encounter for general adult medical examination without abnormal findings: Secondary | ICD-10-CM

## 2021-09-30 DIAGNOSIS — R7303 Prediabetes: Secondary | ICD-10-CM

## 2021-09-30 DIAGNOSIS — D509 Iron deficiency anemia, unspecified: Secondary | ICD-10-CM

## 2021-09-30 DIAGNOSIS — E890 Postprocedural hypothyroidism: Secondary | ICD-10-CM | POA: Diagnosis not present

## 2021-09-30 DIAGNOSIS — Z1231 Encounter for screening mammogram for malignant neoplasm of breast: Secondary | ICD-10-CM | POA: Diagnosis not present

## 2021-09-30 MED ORDER — IBUPROFEN 800 MG PO TABS: 800.0000 mg | ORAL_TABLET | Freq: Three times a day (TID) | ORAL | 0 refills | Status: DC | PRN

## 2021-09-30 NOTE — Progress Notes (Signed)
Date:  09/30/2021   Name:  Kimberly Atkinson   DOB:  Nov 12, 1975   MRN:  [id number]   Chief Complaint: Annual Exam (Breast Exam, No pap.) Kimberly Atkinson is a 46 y.o. female who presents today for her Complete Annual Exam. She feels well. She reports exercising- none. She reports she is sleeping well. Breast complaints - none.  Mammogram: 09/2020 [facility]. Next Mammo schedule April 4th 2023. DEXA: none Pap smear: 02/2018 neg with co-testing - done last year by GYN Colonoscopy: none   There is no immunization history on file for this patient.  Thyroid Problem Presents for follow-up visit. Patient reports no anxiety, constipation, diarrhea, fatigue, palpitations or tremors. The symptoms have been stable.  Migraine  This is a recurrent (followed by Neurology) problem. The pain quality is similar to prior headaches. Pertinent negatives include no abdominal pain, coughing, dizziness, fever, hearing loss, tinnitus or vomiting. She has tried triptans (topiramate and Nurtec - had to stop topamax due to SE) for the symptoms. The treatment provided moderate relief.   Lab Results  Component Value Date   NA 136 09/20/2020   K 4.2 09/20/2020   CO2 19 (L) 09/20/2020   GLUCOSE 97 09/20/2020   BUN 6 09/20/2020   CREATININE 0.79 09/20/2020   CALCIUM 9.6 09/20/2020   EGFR 95 09/20/2020   GFRNONAA 92 12/05/2019   Lab Results  Component Value Date   CHOL 185 09/20/2020   HDL 53 09/20/2020   LDLCALC 111 (H) 09/20/2020   TRIG 118 09/20/2020   CHOLHDL 3.5 09/20/2020   Lab Results  Component Value Date   TSH 0.745 09/20/2020   Lab Results  Component Value Date   HGBA1C 5.8 (H) 09/20/2020   Lab Results  Component Value Date   WBC 10.0 09/20/2020   HGB 11.4 09/20/2020   HCT 35.9 09/20/2020   MCV 79 09/20/2020   PLT 364 09/20/2020   Lab Results  Component Value Date   ALT 21 09/20/2020   AST 23 09/20/2020   ALKPHOS 49 09/20/2020   BILITOT 0.6 09/20/2020   Lab Results  Component  Value Date   VD25OH 35.8 03/27/2020     Review of Systems  Constitutional:  Negative for chills, fatigue and fever.  HENT:  Negative for congestion, hearing loss, tinnitus, trouble swallowing and voice change.   Eyes:  Negative for visual disturbance.  Respiratory:  Negative for cough, chest tightness, shortness of breath and wheezing.   Cardiovascular:  Negative for chest pain, palpitations and leg swelling.  Gastrointestinal:  Negative for abdominal pain, constipation, diarrhea and vomiting.  Endocrine: Negative for polydipsia and polyuria.  Genitourinary:  Negative for dysuria, frequency, genital sores, vaginal bleeding and vaginal discharge.  Musculoskeletal:  Negative for arthralgias, gait problem and joint swelling.  Skin:  Negative for color change and rash.  Neurological:  Positive for headaches. Negative for dizziness, tremors and light-headedness.  Hematological:  Negative for adenopathy. Does not bruise/bleed easily.  Psychiatric/Behavioral:  Negative for dysphoric mood and sleep disturbance. The patient is not nervous/anxious.    Patient Active Problem List   Diagnosis Date Noted   Migraine with aura and without status migrainosus, not intractable 05/28/2021   Pre-diabetes 12/05/2019   Vitamin D deficiency 12/05/2019   Postablative hypothyroidism 12/05/2019   Anemia 12/05/2019   B12 nutritional deficiency 12/05/2019   Hidradenitis axillaris 11/18/2011    Allergies  Allergen Reactions   Topamax [Topiramate] Other (See Comments)    Strange dreams  Past Surgical History:  Procedure Laterality Date   BREAST BIOPSY Left 2018   fibroadenoma   LAPAROSCOPY  08/28/2011   Procedure: LAPAROSCOPY OPERATIVE;  Surgeon: Kimberly Staff, MD;  Location: [facility];  Service: Gynecology;  Laterality: Right;  Fugeration of Endometriosis, Removal of Left Paratubal Cyst./ Chromopertubation    Social History   Tobacco Use   Smoking status: Never   Smokeless tobacco: Never   Vaping Use   Vaping Use: Never used  Substance Use Topics   Alcohol use: No   Drug use: No     Medication list has been reviewed and updated.  Current Meds  Medication Sig   cyanocobalamin 100 MCG tablet Take 100 mcg by mouth daily.   ergocalciferol (VITAMIN D2) 1.25 MG (50000 UT) capsule Take 1 capsule (50,000 Units total) by mouth once a week.   FeFum-FePoly-FA-B Cmp-C-Biot (FOLIVANE-PLUS PO) Take 1 capsule by mouth daily.   folic acid (FOLVITE) 1 MG tablet Take 1 mg by mouth daily.   ibuprofen (ADVIL,MOTRIN) 800 MG tablet Take 800 mg by mouth every 8 (eight) hours as needed. menstrual cramps   levothyroxine (SYNTHROID) 112 MCG tablet Take 1 tablet (112 mcg total) by mouth daily. Takes 112 mcg every day and and 1/2 tablet (67mg) more on Sunday.   Multiple Vitamins-Minerals (MULTIVITAMINS THER. W/MINERALS) TABS Take 1 tablet by mouth daily.   ondansetron (ZOFRAN) 4 MG tablet Take 1 tablet (4 mg total) by mouth every 8 (eight) hours as needed for nausea or vomiting.   Rimegepant Sulfate (NURTEC) 75 MG TBDP Take 1 tablet by mouth every other day.   rizatriptan (MAXALT-MLT) 10 MG disintegrating tablet Take 1 tablet (10 mg total) by mouth as needed for migraine. May repeat in 2 hours if needed. Do not exceed more than 2 tablets in a 24 hour period.   [DISCONTINUED] topiramate (TOPAMAX) 50 MG tablet Take 50 mg by mouth at bedtime.    PHQ 2/9 Scores 09/30/2021 08/16/2021 05/28/2021 09/20/2020  PHQ - 2 Score 0 0 0 0  PHQ- 9 Score 0 0 0 0    GAD 7 : Generalized Anxiety Score 09/30/2021 08/16/2021 05/28/2021 09/20/2020  Nervous, Anxious, on Edge 0 0 0 0  Control/stop worrying 0 0 0 0  Worry too much - different things 0 0 0 0  Trouble relaxing 0 0 0 0  Restless 0 0 0 0  Easily annoyed or irritable 0 0 0 0  Afraid - awful might happen 0 0 0 0  Total GAD 7 Score 0 0 0 0  Anxiety Difficulty Not difficult at all - Not difficult at all -    BP Readings from Last 3 Encounters:  09/30/21 118/74   08/16/21 122/82  08/01/21 (!) 145/82    Physical Exam Vitals and nursing note reviewed.  Constitutional:      General: She is not in acute distress.    Appearance: She is well-developed.  HENT:     Head: Normocephalic and atraumatic.     Right Ear: Tympanic membrane and ear canal normal.     Left Ear: Tympanic membrane and ear canal normal.     Nose:     Right Sinus: No maxillary sinus tenderness.     Left Sinus: No maxillary sinus tenderness.  Eyes:     General: No scleral icterus.       Right eye: No discharge.        Left eye: No discharge.     Conjunctiva/sclera: Conjunctivae normal.  Neck:  Thyroid: No thyromegaly.     Vascular: No carotid bruit.  Cardiovascular:     Rate and Rhythm: Normal rate and regular rhythm.     Pulses: Normal pulses.     Heart sounds: Normal heart sounds.  Pulmonary:     Effort: Pulmonary effort is normal. No respiratory distress.     Breath sounds: No wheezing.  Chest:  Breasts:    Right: No mass, nipple discharge, skin change or tenderness.     Left: No mass, nipple discharge, skin change or tenderness.  Abdominal:     General: Bowel sounds are normal.     Palpations: Abdomen is soft.     Tenderness: There is no abdominal tenderness.  Musculoskeletal:     Cervical back: Normal range of motion. No erythema.     Right lower leg: No edema.     Left lower leg: No edema.  Lymphadenopathy:     Cervical: No cervical adenopathy.  Skin:    General: Skin is warm and dry.     Findings: No rash.  Neurological:     Mental Status: She is alert and oriented to person, place, and time.     Cranial Nerves: No cranial nerve deficit.     Sensory: No sensory deficit.     Deep Tendon Reflexes: Reflexes are normal and symmetric.  Psychiatric:        Attention and Perception: Attention normal.        Mood and Affect: Mood normal.    Wt Readings from Last 3 Encounters:  09/30/21 234 lb (106.1 kg)  08/16/21 236 lb (107 kg)  08/01/21 234 lb 3.2  oz (106.2 kg)    BP 118/74    Pulse 80    Ht 5' 7.5" (1.715 m)    Wt 234 lb (106.1 kg)    SpO2 97%    BMI 36.11 kg/m   Assessment and Plan: 1. Annual physical exam Exam is normal except for weight. Encourage regular exercise and appropriate dietary changes. - Lipid panel  2. Encounter for screening mammogram for breast cancer Scheduled at [facility]  3. Colon cancer screening Will refer - Ambulatory referral to Gastroenterology  4. Postablative hypothyroidism supplemented - Comprehensive metabolic panel - TSH + free T4  5. Pre-diabetes - Hemoglobin A1c  6. Vitamin D deficiency Continue weekly high dose  7. Iron deficiency anemia, unspecified iron deficiency anemia type Check labs and advise - CBC with Differential/Platelet  8. Migraine with aura and without status migrainosus, not intractable Clinically stable without change in character of migraine headaches but the frequency is decreasing with some life style changes as well as the addition of Nurtec. She could not tolerate Topamax. Headaches respond well to current therapy with Nurtec qod. Will continue current plan, follow up if worsening.   Partially dictated using Editor, commissioning. Any errors are unintentional.  Halina Maidens, MD [facility]  09/30/2021

## 2021-10-01 ENCOUNTER — Other Ambulatory Visit: Payer: Self-pay

## 2021-10-01 ENCOUNTER — Other Ambulatory Visit: Payer: Self-pay | Admitting: Internal Medicine

## 2021-10-01 DIAGNOSIS — E890 Postprocedural hypothyroidism: Secondary | ICD-10-CM

## 2021-10-01 LAB — HEMOGLOBIN A1C
Est. average glucose Bld gHb Est-mCnc: 128 mg/dL
Hgb A1c MFr Bld: 6.1 % — ABNORMAL HIGH (ref 4.8–5.6)

## 2021-10-01 LAB — COMPREHENSIVE METABOLIC PANEL
ALT: 14 IU/L (ref 0–32)
AST: 18 IU/L (ref 0–40)
Albumin/Globulin Ratio: 1.7 (ref 1.2–2.2)
Albumin: 4.5 g/dL (ref 3.8–4.8)
Alkaline Phosphatase: 59 IU/L (ref 44–121)
BUN/Creatinine Ratio: 8 — ABNORMAL LOW (ref 9–23)
BUN: 7 mg/dL (ref 6–24)
Bilirubin Total: 0.4 mg/dL (ref 0.0–1.2)
CO2: 22 mmol/L (ref 20–29)
Calcium: 9.8 mg/dL (ref 8.7–10.2)
Chloride: 99 mmol/L (ref 96–106)
Creatinine, Ser: 0.92 mg/dL (ref 0.57–1.00)
Globulin, Total: 2.7 g/dL (ref 1.5–4.5)
Glucose: 100 mg/dL — ABNORMAL HIGH (ref 70–99)
Potassium: 4 mmol/L (ref 3.5–5.2)
Sodium: 135 mmol/L (ref 134–144)
Total Protein: 7.2 g/dL (ref 6.0–8.5)
eGFR: 78 mL/min/{1.73_m2} (ref >59)

## 2021-10-01 LAB — CBC WITH DIFFERENTIAL/PLATELET
Basophils Absolute: 0.1 10*3/uL (ref 0.0–0.2)
Basos: 1 %
EOS (ABSOLUTE): 0.4 10*3/uL (ref 0.0–0.4)
Eos: 4 %
Hematocrit: 27.9 % — ABNORMAL LOW (ref 34.0–46.6)
Hemoglobin: 7.8 g/dL — ABNORMAL LOW (ref 11.1–15.9)
Immature Grans (Abs): 0 10*3/uL (ref 0.0–0.1)
Immature Granulocytes: 0 %
Lymphocytes Absolute: 3.7 10*3/uL — ABNORMAL HIGH (ref 0.7–3.1)
Lymphs: 41 %
MCH: 19.5 pg — ABNORMAL LOW (ref 26.6–33.0)
MCHC: 28 g/dL — ABNORMAL LOW (ref 31.5–35.7)
MCV: 70 fL — ABNORMAL LOW (ref 79–97)
Monocytes Absolute: 0.5 10*3/uL (ref 0.1–0.9)
Monocytes: 6 %
Neutrophils Absolute: 4.4 10*3/uL (ref 1.4–7.0)
Neutrophils: 48 %
Platelets: 427 10*3/uL (ref 150–450)
RBC: 4 x10E6/uL (ref 3.77–5.28)
RDW: 16.9 % — ABNORMAL HIGH (ref 11.7–15.4)
WBC: 9.1 10*3/uL (ref 3.4–10.8)

## 2021-10-01 LAB — LIPID PANEL
Chol/HDL Ratio: 4.3 ratio (ref 0.0–4.4)
Cholesterol, Total: 230 mg/dL — ABNORMAL HIGH (ref 100–199)
HDL: 54 mg/dL (ref >39)
LDL Chol Calc (NIH): 149 mg/dL — ABNORMAL HIGH (ref 0–99)
Triglycerides: 149 mg/dL (ref 0–149)
VLDL Cholesterol Cal: 27 mg/dL (ref 5–40)

## 2021-10-01 LAB — TSH+FREE T4
Free T4: 0.81 ng/dL — ABNORMAL LOW (ref 0.82–1.77)
TSH: 44.8 u[IU]/mL — ABNORMAL HIGH (ref 0.450–4.500)

## 2021-10-01 MED ORDER — LEVOTHYROXINE SODIUM 125 MCG PO TABS: 125.0000 ug | ORAL_TABLET | Freq: Every day | ORAL | 1 refills | Status: DC

## 2021-10-08 ENCOUNTER — Telehealth: Payer: Self-pay

## 2021-10-08 NOTE — Telephone Encounter (Signed)
CALLED PATIENT NO ANSWER LEFT VOICEMAIL FOR A CALL BACK °Letter sent °

## 2021-10-23 ENCOUNTER — Other Ambulatory Visit: Payer: Self-pay

## 2021-10-23 DIAGNOSIS — Z90710 Acquired absence of both cervix and uterus: Secondary | ICD-10-CM | POA: Insufficient documentation

## 2021-11-01 ENCOUNTER — Other Ambulatory Visit: Payer: Self-pay

## 2021-11-01 ENCOUNTER — Other Ambulatory Visit: Payer: 59

## 2021-11-01 DIAGNOSIS — D649 Anemia, unspecified: Secondary | ICD-10-CM

## 2021-11-02 LAB — CBC WITH DIFFERENTIAL/PLATELET
Basophils Absolute: 0.1 10*3/uL (ref 0.0–0.2)
Basos: 1 %
EOS (ABSOLUTE): 0.2 10*3/uL (ref 0.0–0.4)
Eos: 3 %
Hematocrit: 30.8 % — ABNORMAL LOW (ref 34.0–46.6)
Hemoglobin: 8.9 g/dL — ABNORMAL LOW (ref 11.1–15.9)
Immature Grans (Abs): 0 10*3/uL (ref 0.0–0.1)
Immature Granulocytes: 0 %
Lymphocytes Absolute: 3.3 10*3/uL — ABNORMAL HIGH (ref 0.7–3.1)
Lymphs: 38 %
MCH: 21 pg — ABNORMAL LOW (ref 26.6–33.0)
MCHC: 28.9 g/dL — ABNORMAL LOW (ref 31.5–35.7)
MCV: 73 fL — ABNORMAL LOW (ref 79–97)
Monocytes Absolute: 0.5 10*3/uL (ref 0.1–0.9)
Monocytes: 6 %
Neutrophils Absolute: 4.6 10*3/uL (ref 1.4–7.0)
Neutrophils: 52 %
Platelets: 379 10*3/uL (ref 150–450)
RBC: 4.24 x10E6/uL (ref 3.77–5.28)
RDW: 20 % — ABNORMAL HIGH (ref 11.7–15.4)
WBC: 8.9 10*3/uL (ref 3.4–10.8)

## 2021-11-02 LAB — IRON,TIBC AND FERRITIN PANEL
Ferritin: 17 ng/mL (ref 15–150)
Iron Saturation: 6 % — CL (ref 15–55)
Iron: 22 ug/dL — ABNORMAL LOW (ref 27–159)
Total Iron Binding Capacity: 360 ug/dL (ref 250–450)
UIBC: 338 ug/dL (ref 131–425)

## 2021-11-04 NOTE — Progress Notes (Signed)
Pt informed to take iron supplement with orange juice. ? ?KP ?

## 2021-11-04 NOTE — Progress Notes (Signed)
Informed pt with labs per Dr. Army Melia. Pt verbalized understanding. Pt stated she is taking folivane iron tablets she is taking them daily. ? ?KP

## 2022-01-02 ENCOUNTER — Other Ambulatory Visit: Payer: Self-pay

## 2022-01-02 ENCOUNTER — Ambulatory Visit: Payer: Self-pay | Admitting: *Deleted

## 2022-01-02 DIAGNOSIS — D649 Anemia, unspecified: Secondary | ICD-10-CM

## 2022-01-02 NOTE — Telephone Encounter (Signed)
Called pt let her know that iron labs have been ordered. Pt verbalized understanding.  KP

## 2022-01-02 NOTE — Telephone Encounter (Signed)
Do I need to come in for labs?   We are monitoring my thyroid and iron levels.   Both have been low.   It's been 2 months so I was just wondering if my iron and thyroid needed to be rechecked since my medication was changed.

## 2022-01-02 NOTE — Telephone Encounter (Signed)
Message from Sharene Skeans sent at 01/02/2022 10:25 AM EDT  Summary: thyroid and iron lab procedure questions   Pt asked to speak with Dr. Oneal Deputy Nurse about thyroid and iron lab procedures / please advise   ----- Message from Sharene Skeans sent at 01/02/2022 10:25 AM EDT -----  Pt asked to speak with Dr. Oneal Deputy Nurse about thyroid and iron lab procedures / please advise           Call History   Type Contact Phone/Fax User  01/02/2022 10:24 AM EDT Phone (Incoming) [place], [place] (Self) [phone] Jerilynn Mages) Alanda Slim    Reason for Disposition . [1] Caller requesting NON-URGENT health information AND [2] PCP's office is the best resource  Answer Assessment - Initial Assessment Questions 1. REASON FOR CALL or QUESTION: "What is your reason for calling today?" or "How can I best help you?" or "What question do you have that I can help answer?"     Pt wanting to know if she needs to have follow up labs done since her thyroid and iron medications were adjusted because her levels were low.  I've sent a message to Dr Army Melia.  Protocols used: Information Only Call - No Triage-A-AH

## 2022-01-06 ENCOUNTER — Ambulatory Visit: Payer: 59 | Admitting: Internal Medicine

## 2022-01-07 LAB — IRON,TIBC AND FERRITIN PANEL
Ferritin: 30 ng/mL (ref 15–150)
Iron Saturation: 17 % (ref 15–55)
Iron: 59 ug/dL (ref 27–159)
Total Iron Binding Capacity: 347 ug/dL (ref 250–450)
UIBC: 288 ug/dL (ref 131–425)

## 2022-02-11 ENCOUNTER — Ambulatory Visit: Payer: 59 | Admitting: Neurology

## 2022-05-01 ENCOUNTER — Telehealth: Payer: Self-pay

## 2022-05-01 NOTE — Telephone Encounter (Signed)
Approved thru 05/02/2023.  KP

## 2022-05-01 NOTE — Telephone Encounter (Signed)
Nurtec 75 MG  PA completed waiting on insurance approval.   Key: [id number]  KP

## 2022-05-05 ENCOUNTER — Other Ambulatory Visit: Payer: Self-pay | Admitting: Internal Medicine

## 2022-05-05 DIAGNOSIS — E890 Postprocedural hypothyroidism: Secondary | ICD-10-CM

## 2022-05-06 ENCOUNTER — Telehealth: Payer: Self-pay | Admitting: Internal Medicine

## 2022-05-06 NOTE — Telephone Encounter (Signed)
Patient called in asking for rx for iron [facility]. [facility]

## 2022-05-06 NOTE — Telephone Encounter (Signed)
Requested medication (s) are due for refill today:   Provider to review  Requested medication (s) are on the active medication list:   Yes for both  Future visit scheduled:   Yes   Last ordered: Vitamin D2 05/28/2021 #12, 1;   Synthroid 10/01/2021 #90, 1 refill  Returned because it's a non delegated refill.     Requested Prescriptions  Pending Prescriptions Disp Refills   Vitamin D, Ergocalciferol, 50000 units CAPS [Pharmacy Med Name: ergocalciferol (vitamin D2) 1,250 mcg (50,000 unit) capsule] 12 capsule 1    Sig: Take 1 capsule (50,000 Units total) by mouth once a week.     Endocrinology:  Vitamins - Vitamin D Supplementation 2 Failed - 05/05/2022 12:42 PM      Failed - Manual Review: Route requests for 50,000 IU strength to the provider      Failed - Vitamin D in normal range and within 360 days    Vit D, 25-Hydroxy  Date Value Ref Range Status  03/27/2020 35.8 30.0 - 100.0 ng/mL Final    Comment:    Vitamin D deficiency has been defined by the [place] practice guideline as a level of serum 25-OH vitamin D less than 20 ng/mL (1,2). The Endocrine Society went on to further define vitamin D insufficiency as a level between 21 and 29 ng/mL (2). 1. IOM (Institute of Medicine). 2010. Dietary reference    intakes for calcium and D. [place]: The    [employer]. 2. Holick MF, [place], Bischoff-Ferrari HA, et al.    Evaluation, treatment, and prevention of vitamin D    deficiency: an Endocrine Society clinical practice    guideline. JCEM. 2011 Jul; 96(7):1911-30.          Passed - Ca in normal range and within 360 days    Calcium  Date Value Ref Range Status  09/30/2021 9.8 8.7 - 10.2 mg/dL Final   Calcium, Ion  Date Value Ref Range Status  05/08/2013 1.26 (H) 1.12 - 1.23 mmol/L Final         Passed - Valid encounter within last 12 months    Recent Outpatient Visits           7 months ago Annual physical exam    [place] [facility] at [facility], Jesse Sans, MD   8 months ago Eustachian tube dysfunction, bilateral   [place] [facility] at [facility], Jesse Sans, MD   11 months ago Migraine with aura and without status migrainosus, not intractable   [place] [facility] at [facility], Jesse Sans, MD   1 year ago Annual physical exam   [place] [facility] at [facility], Jesse Sans, MD   2 years ago Postablative hypothyroidism   [place] [facility] at [facility], Jesse Sans, MD       Future Appointments             In 5 months Glean Hess, MD [facility] and [facility] Medicine at [facility], [facility]             levothyroxine (SYNTHROID) 125 MCG tablet [Pharmacy Med Name: levothyroxine 125 mcg tablet] 90 tablet 1    Sig: Take 1 tablet (125 mcg total) by mouth daily.     Endocrinology:  Hypothyroid Agents Failed - 05/05/2022 12:42 PM  Failed - TSH in normal range and within 360 days    TSH  Date Value Ref Range Status  09/30/2021 44.800 (H) 0.450 - 4.500 uIU/mL Final         Passed - Valid encounter within last 12 months    Recent Outpatient Visits           7 months ago Annual physical exam   [place] [facility] at [facility], Jesse Sans, MD   8 months ago Eustachian tube dysfunction, bilateral   [place] [facility] at [facility], Jesse Sans, MD   11 months ago Migraine with aura and without status migrainosus, not intractable    Primary Care and Sports Medicine at [facility], Jesse Sans, MD   1 year ago Annual physical exam   [facility] and [facility] at [facility], Jesse Sans, MD   2 years ago Postablative hypothyroidism    Primary Care  and Sports Medicine at [facility], Jesse Sans, MD       Future Appointments             In 5 months Army Melia, Jesse Sans, MD [place] [facility] at [facility], [facility]

## 2022-05-07 ENCOUNTER — Other Ambulatory Visit: Payer: Self-pay

## 2022-05-07 DIAGNOSIS — D509 Iron deficiency anemia, unspecified: Secondary | ICD-10-CM

## 2022-05-07 MED ORDER — INTEGRA PLUS PO CAPS: 1.0000 | ORAL_CAPSULE | ORAL | 0 refills | Status: DC

## 2022-05-07 NOTE — Telephone Encounter (Signed)
Prescription sent. Pt verbalized understanding.  KP

## 2022-05-13 ENCOUNTER — Other Ambulatory Visit: Payer: Self-pay

## 2022-05-13 ENCOUNTER — Telehealth: Payer: Self-pay

## 2022-05-13 DIAGNOSIS — D509 Iron deficiency anemia, unspecified: Secondary | ICD-10-CM

## 2022-05-13 MED ORDER — INTEGRA PLUS PO CAPS: 1.0000 | ORAL_CAPSULE | ORAL | 0 refills | Status: DC

## 2022-05-13 NOTE — Telephone Encounter (Signed)
Call from [place] at [facility] regarding Rx for [employer] plus.  Order was for 36 tablets. Miks states that medication comes as a stock filled bottle of 90. Ronalee Belts is asking if number dispensed can be changed to 90.   Please advise.  [place] [phone].  FeFum-FePoly-FA-B Cmp-C-Biot (INTEGRA PLUS) CAPS [[id number]]    Order Details Dose: 1 each Route: Oral Frequency: 3 times weekly  Dispense Quantity: 36 capsule

## 2022-08-28 ENCOUNTER — Other Ambulatory Visit: Payer: Self-pay | Admitting: Internal Medicine

## 2022-08-28 DIAGNOSIS — E890 Postprocedural hypothyroidism: Secondary | ICD-10-CM

## 2022-08-28 NOTE — Telephone Encounter (Signed)
Requested Prescriptions  Pending Prescriptions Disp Refills   levothyroxine (SYNTHROID) 125 MCG tablet [Pharmacy Med Name: levothyroxine 125 mcg tablet] 90 tablet 0    Sig: Take 1 tablet (125 mcg total) by mouth daily.     Endocrinology:  Hypothyroid Agents Failed - 08/28/2022 12:11 PM      Failed - TSH in normal range and within 360 days    TSH  Date Value Ref Range Status  09/30/2021 44.800 (H) 0.450 - 4.500 uIU/mL Final         Passed - Valid encounter within last 12 months    Recent Outpatient Visits           11 months ago Annual physical exam   [place] [facility] at [facility], Jesse Sans, MD   1 year ago Eustachian tube dysfunction, bilateral   [place] [facility] at [facility], Jesse Sans, MD   1 year ago Migraine with aura and without status migrainosus, not intractable   [place] [facility] at [facility] Jesse Sans, MD   1 year ago Annual physical exam   [facility] & [facility] at [facility], Jesse Sans, MD   2 years ago Postablative hypothyroidism   [place] at [facility], Jesse Sans, MD       Future Appointments             In 1 month Army Melia, Jesse Sans, MD [place] at [facility], [facility]

## 2022-09-01 ENCOUNTER — Telehealth: Payer: Self-pay | Admitting: Internal Medicine

## 2022-09-01 ENCOUNTER — Other Ambulatory Visit: Payer: Self-pay

## 2022-09-01 DIAGNOSIS — G43109 Migraine with aura, not intractable, without status migrainosus: Secondary | ICD-10-CM

## 2022-09-01 MED ORDER — NURTEC 75 MG PO TBDP: 1.0000 | ORAL_TABLET | ORAL | 3 refills | Status: DC

## 2022-09-01 NOTE — Telephone Encounter (Signed)
Copied from [place]. Topic: General - Other >> Sep 01, 2022 12:07 PM Sabas Sous wrote: Reason for CRM: Pt called and states that her Rx for Nurtec has been sent to the wrong pharmacy. She says it needs to be sent to [facility] on [address].

## 2022-09-01 NOTE — Telephone Encounter (Signed)
Resent to [facility].

## 2022-10-03 ENCOUNTER — Encounter: Payer: Self-pay | Admitting: Internal Medicine

## 2022-10-03 ENCOUNTER — Ambulatory Visit (INDEPENDENT_AMBULATORY_CARE_PROVIDER_SITE_OTHER): Payer: 59 | Admitting: Internal Medicine

## 2022-10-03 VITALS — BP 120/78 | HR 82 | Resp 99 | Ht 67.5 in | Wt 237.0 lb

## 2022-10-03 DIAGNOSIS — G43109 Migraine with aura, not intractable, without status migrainosus: Secondary | ICD-10-CM | POA: Diagnosis not present

## 2022-10-03 DIAGNOSIS — R7303 Prediabetes: Secondary | ICD-10-CM

## 2022-10-03 DIAGNOSIS — E785 Hyperlipidemia, unspecified: Secondary | ICD-10-CM

## 2022-10-03 DIAGNOSIS — Z1231 Encounter for screening mammogram for malignant neoplasm of breast: Secondary | ICD-10-CM | POA: Diagnosis not present

## 2022-10-03 DIAGNOSIS — E890 Postprocedural hypothyroidism: Secondary | ICD-10-CM | POA: Diagnosis not present

## 2022-10-03 DIAGNOSIS — Z0000 Encounter for general adult medical examination without abnormal findings: Secondary | ICD-10-CM

## 2022-10-03 DIAGNOSIS — Z1211 Encounter for screening for malignant neoplasm of colon: Secondary | ICD-10-CM

## 2022-10-03 DIAGNOSIS — E559 Vitamin D deficiency, unspecified: Secondary | ICD-10-CM

## 2022-10-03 DIAGNOSIS — D509 Iron deficiency anemia, unspecified: Secondary | ICD-10-CM

## 2022-10-03 MED ORDER — VITAMIN D (ERGOCALCIFEROL) 50000 UNITS PO CAPS: 1.0000 | ORAL_CAPSULE | ORAL | 3 refills | Status: DC

## 2022-10-03 MED ORDER — IBUPROFEN 800 MG PO TABS: 800.0000 mg | ORAL_TABLET | Freq: Three times a day (TID) | ORAL | 0 refills | Status: DC | PRN

## 2022-10-03 NOTE — Assessment & Plan Note (Signed)
Diet control alone at this time. Lab Results  Component Value Date   HGBA1C 6.1 (H) 09/30/2021

## 2022-10-03 NOTE — Assessment & Plan Note (Signed)
Doing well on Nurtec taken PRN or qod for 1-2 weeks when headaches flare

## 2022-10-03 NOTE — Progress Notes (Signed)
Date:  10/03/2022   Name:  Kimberly Atkinson   DOB:  [birth year]   MRN:  [id number]   Chief Complaint: Annual Exam Kimberly Atkinson is a 47 y.o. female who presents today for her Complete Annual Exam. She feels fairly well. She reports exercising some. She reports she is sleeping fairly well. Breast complaints none.  Mammogram: 10/2021 [facility] Dr. Garwin Brothers DEXA: none Pap smear: 02/2018 with GYN - done last year need results Colonoscopy: none  Health Maintenance Due  Topic Date Due   DTaP/Tdap/Td (1 - Tdap) Never done   COLONOSCOPY (Pts 45-12yrs Insurance coverage will need to be confirmed)  Never done     There is no immunization history on file for this patient.  Thyroid Problem Presents for follow-up visit. Patient reports no anxiety, constipation, diarrhea, fatigue, palpitations or tremors. The symptoms have been stable.  Diabetes She presents for her follow-up diabetic visit. Diabetes type: prediabetes. Her disease course has been stable. Pertinent negatives for hypoglycemia include no dizziness, headaches, nervousness/anxiousness or tremors. Pertinent negatives for diabetes include no chest pain, no fatigue, no polydipsia and no polyuria.  Migraine  This is a recurrent problem. The problem has been gradually improving. Pertinent negatives include no abdominal pain, coughing, dizziness, fever, hearing loss, tinnitus or vomiting. The symptoms are aggravated by medications. Treatments tried: Nurtec works well.    Lab Results  Component Value Date   NA 135 09/30/2021   K 4.0 09/30/2021   CO2 22 09/30/2021   GLUCOSE 100 (H) 09/30/2021   BUN 7 09/30/2021   CREATININE 0.92 09/30/2021   CALCIUM 9.8 09/30/2021   EGFR 78 09/30/2021   GFRNONAA 92 12/05/2019   Lab Results  Component Value Date   CHOL 230 (H) 09/30/2021   HDL 54 09/30/2021   LDLCALC 149 (H) 09/30/2021   TRIG 149 09/30/2021   CHOLHDL 4.3 09/30/2021   Lab Results  Component Value Date   TSH 44.800 (H)  09/30/2021   Lab Results  Component Value Date   HGBA1C 6.1 (H) 09/30/2021   Lab Results  Component Value Date   WBC 8.9 11/01/2021   HGB 8.9 (L) 11/01/2021   HCT 30.8 (L) 11/01/2021   MCV 73 (L) 11/01/2021   PLT 379 11/01/2021   Lab Results  Component Value Date   ALT 14 09/30/2021   AST 18 09/30/2021   ALKPHOS 59 09/30/2021   BILITOT 0.4 09/30/2021   Lab Results  Component Value Date   VD25OH 35.8 03/27/2020     Review of Systems  Constitutional:  Negative for chills, fatigue and fever.  HENT:  Negative for congestion, hearing loss, tinnitus, trouble swallowing and voice change.   Eyes:  Negative for visual disturbance.  Respiratory:  Negative for cough, chest tightness, shortness of breath and wheezing.   Cardiovascular:  Negative for chest pain, palpitations and leg swelling.  Gastrointestinal:  Negative for abdominal pain, constipation, diarrhea and vomiting.  Endocrine: Negative for polydipsia and polyuria.  Genitourinary:  Negative for dysuria, frequency, genital sores, vaginal bleeding and vaginal discharge.  Musculoskeletal:  Negative for arthralgias, gait problem and joint swelling.  Skin:  Negative for color change and rash.  Neurological:  Negative for dizziness, tremors, light-headedness and headaches.  Hematological:  Negative for adenopathy. Does not bruise/bleed easily.  Psychiatric/Behavioral:  Negative for dysphoric mood and sleep disturbance. The patient is not nervous/anxious.     Patient Active Problem List   Diagnosis Date Noted   Status post total hysterectomy 10/23/2021  Migraine with aura and without status migrainosus, not intractable 05/28/2021   Pre-diabetes 12/05/2019   Vitamin D deficiency 12/05/2019   Postablative hypothyroidism 12/05/2019   Anemia 12/05/2019   B12 nutritional deficiency 12/05/2019   Hidradenitis axillaris 11/18/2011    Allergies  Allergen Reactions   Topamax [Topiramate] Other (See Comments)    Strange dreams     Past Surgical History:  Procedure Laterality Date   BREAST BIOPSY Left 2018   fibroadenoma   LAPAROSCOPY  08/28/2011   Procedure: LAPAROSCOPY OPERATIVE;  Surgeon: Marvene Staff, MD;  Location: [facility];  Service: Gynecology;  Laterality: Right;  Fugeration of Endometriosis, Removal of Left Paratubal Cyst./ Chromopertubation   TOTAL ABDOMINAL HYSTERECTOMY      Social History   Tobacco Use   Smoking status: Never   Smokeless tobacco: Never  Vaping Use   Vaping Use: Never used  Substance Use Topics   Alcohol use: No   Drug use: No     Medication list has been reviewed and updated.  Current Meds  Medication Sig   cyanocobalamin 100 MCG tablet Take 100 mcg by mouth daily.   FeFum-FePoly-FA-B Cmp-C-Biot (INTEGRA PLUS) CAPS Take 1 each by mouth 3 (three) times a week.   levothyroxine (SYNTHROID) 125 MCG tablet Take 1 tablet (125 mcg total) by mouth daily.   Multiple Vitamins-Minerals (MULTIVITAMINS THER. W/MINERALS) TABS Take 1 tablet by mouth daily.   ondansetron (ZOFRAN) 4 MG tablet Take 1 tablet (4 mg total) by mouth every 8 (eight) hours as needed for nausea or vomiting.   Rimegepant Sulfate (NURTEC) 75 MG TBDP Take 1 tablet (75 mg total) by mouth every other day.   rizatriptan (MAXALT-MLT) 10 MG disintegrating tablet Take 1 tablet (10 mg total) by mouth as needed for migraine. May repeat in 2 hours if needed. Do not exceed more than 2 tablets in a 24 hour period.   [DISCONTINUED] folic acid (FOLVITE) 1 MG tablet Take 1 mg by mouth daily.   [DISCONTINUED] ibuprofen (ADVIL) 800 MG tablet Take 1 tablet (800 mg total) by mouth every 8 (eight) hours as needed. menstrual cramps   [DISCONTINUED] Vitamin D, Ergocalciferol, 50000 units CAPS Take 1 capsule (50,000 Units total) by mouth once a week.       10/03/2022   10:10 AM 09/30/2021   10:12 AM 08/16/2021    8:12 AM 05/28/2021    2:13 PM  GAD 7 : Generalized Anxiety Score  Nervous, Anxious, on Edge 0 0 0 0  Control/stop  worrying 0 0 0 0  Worry too much - different things 0 0 0 0  Trouble relaxing 0 0 0 0  Restless 0 0 0 0  Easily annoyed or irritable 0 0 0 0  Afraid - awful might happen 0 0 0 0  Total GAD 7 Score 0 0 0 0  Anxiety Difficulty Not difficult at all Not difficult at all  Not difficult at all       10/03/2022   10:09 AM 09/30/2021   10:11 AM 08/16/2021    8:12 AM  Depression screen PHQ 2/9  Decreased Interest 0 0 0  Down, Depressed, Hopeless 0 0 0  PHQ - 2 Score 0 0 0  Altered sleeping 0 0 0  Tired, decreased energy 0 0 0  Change in appetite 0 0 0  Feeling bad or failure about yourself  0 0 0  Trouble concentrating 0 0 0  Moving slowly or fidgety/restless 0 0 0  Suicidal thoughts  0  0  PHQ-9 Score 0 0 0  Difficult doing work/chores Not difficult at all Not difficult at all     BP Readings from Last 3 Encounters:  10/03/22 120/78  09/30/21 118/74  08/16/21 122/82    Physical Exam Vitals and nursing note reviewed.  Constitutional:      General: She is not in acute distress.    Appearance: She is well-developed.  HENT:     Head: Normocephalic and atraumatic.     Right Ear: Tympanic membrane and ear canal normal.     Left Ear: Tympanic membrane and ear canal normal.     Nose:     Right Sinus: No maxillary sinus tenderness.     Left Sinus: No maxillary sinus tenderness.  Eyes:     General: No scleral icterus.       Right eye: No discharge.        Left eye: No discharge.     Conjunctiva/sclera: Conjunctivae normal.  Neck:     Thyroid: No thyromegaly.     Vascular: No carotid bruit.  Cardiovascular:     Rate and Rhythm: Normal rate and regular rhythm.     Pulses: Normal pulses.     Heart sounds: Normal heart sounds.  Pulmonary:     Effort: Pulmonary effort is normal. No respiratory distress.     Breath sounds: No wheezing.  Chest:  Breasts:    Right: No mass, nipple discharge, skin change or tenderness.     Left: No mass, nipple discharge, skin change or tenderness.   Abdominal:     General: Bowel sounds are normal.     Palpations: Abdomen is soft.     Tenderness: There is no abdominal tenderness.  Musculoskeletal:     Cervical back: Normal range of motion. No erythema.     Right lower leg: No edema.     Left lower leg: No edema.  Lymphadenopathy:     Cervical: No cervical adenopathy.  Skin:    General: Skin is warm and dry.     Findings: No rash.  Neurological:     Mental Status: She is alert and oriented to person, place, and time.     Cranial Nerves: No cranial nerve deficit.     Sensory: No sensory deficit.     Deep Tendon Reflexes: Reflexes are normal and symmetric.  Psychiatric:        Attention and Perception: Attention normal.        Mood and Affect: Mood normal.     Wt Readings from Last 3 Encounters:  10/03/22 237 lb (107.5 kg)  09/30/21 234 lb (106.1 kg)  08/16/21 236 lb (107 kg)    BP 120/78   Pulse 82   Resp (!) 99   Ht 5' 7.5" (1.715 m)   Wt 237 lb (107.5 kg)   LMP 09/18/2022 (Exact Date)   BMI 36.57 kg/m   Assessment and Plan:  Problem List Items Addressed This Visit       Cardiovascular and Mediastinum   Migraine with aura and without status migrainosus, not intractable    Doing well on Nurtec taken PRN or qod for 1-2 weeks when headaches flare      Relevant Medications   ibuprofen (ADVIL) 800 MG tablet     Endocrine   Postablative hypothyroidism    Supplemented       Relevant Orders   TSH + free T4     Other   Anemia   Relevant Orders   Iron,  TIBC and Ferritin Panel   Pre-diabetes    Diet control alone at this time. Lab Results  Component Value Date   HGBA1C 6.1 (H) 09/30/2021        Relevant Orders   Hemoglobin A1c   Vitamin D deficiency    Taking daily vitamin D      Relevant Medications   Vitamin D, Ergocalciferol, 50000 units CAPS   Other Visit Diagnoses     Annual physical exam    -  Primary   Relevant Orders   CBC with Differential/Platelet   Comprehensive metabolic  panel   Hemoglobin A1c   Lipid panel   TSH + free T4   VITAMIN D 25 Hydroxy (Vit-D Deficiency, Fractures)   Iron, TIBC and Ferritin Panel   Encounter for screening mammogram for breast cancer       done at [facility] by GYN   Colon cancer screening       Relevant Orders   Ambulatory referral to Gastroenterology   Mild hyperlipidemia       Relevant Orders   Lipid panel       Return in about 6 months (around 04/05/2023) for prediabetes.   Partially dictated using [place], any errors are not intentional.  Kimberly Hess, MD [place], [place]

## 2022-10-03 NOTE — Assessment & Plan Note (Signed)
Supplemented 

## 2022-10-03 NOTE — Assessment & Plan Note (Signed)
Taking daily vitamin D 

## 2022-10-04 LAB — IRON,TIBC AND FERRITIN PANEL
Ferritin: 9 ng/mL — ABNORMAL LOW (ref 15–150)
Iron Saturation: 4 % — CL (ref 15–55)
Iron: 16 ug/dL — ABNORMAL LOW (ref 27–159)
Total Iron Binding Capacity: 385 ug/dL (ref 250–450)
UIBC: 369 ug/dL (ref 131–425)

## 2022-10-04 LAB — COMPREHENSIVE METABOLIC PANEL
ALT: 20 IU/L (ref 0–32)
AST: 25 IU/L (ref 0–40)
Albumin/Globulin Ratio: 1.8 (ref 1.2–2.2)
Albumin: 4.2 g/dL (ref 3.9–4.9)
Alkaline Phosphatase: 57 IU/L (ref 44–121)
BUN/Creatinine Ratio: 6 — ABNORMAL LOW (ref 9–23)
BUN: 5 mg/dL — ABNORMAL LOW (ref 6–24)
Bilirubin Total: 0.3 mg/dL (ref 0.0–1.2)
CO2: 22 mmol/L (ref 20–29)
Calcium: 9.7 mg/dL (ref 8.7–10.2)
Chloride: 103 mmol/L (ref 96–106)
Creatinine, Ser: 0.82 mg/dL (ref 0.57–1.00)
Globulin, Total: 2.4 g/dL (ref 1.5–4.5)
Glucose: 99 mg/dL (ref 70–99)
Potassium: 4.2 mmol/L (ref 3.5–5.2)
Sodium: 136 mmol/L (ref 134–144)
Total Protein: 6.6 g/dL (ref 6.0–8.5)
eGFR: 89 mL/min/{1.73_m2} (ref >59)

## 2022-10-04 LAB — TSH+FREE T4
Free T4: 0.87 ng/dL (ref 0.82–1.77)
TSH: 27.8 u[IU]/mL — ABNORMAL HIGH (ref 0.450–4.500)

## 2022-10-04 LAB — CBC WITH DIFFERENTIAL/PLATELET
Basophils Absolute: 0.1 10*3/uL (ref 0.0–0.2)
Basos: 1 %
EOS (ABSOLUTE): 0.3 10*3/uL (ref 0.0–0.4)
Eos: 4 %
Hematocrit: 26.5 % — ABNORMAL LOW (ref 34.0–46.6)
Hemoglobin: 8.1 g/dL — ABNORMAL LOW (ref 11.1–15.9)
Immature Grans (Abs): 0 10*3/uL (ref 0.0–0.1)
Immature Granulocytes: 0 %
Lymphocytes Absolute: 3.3 10*3/uL — ABNORMAL HIGH (ref 0.7–3.1)
Lymphs: 41 %
MCH: 22.9 pg — ABNORMAL LOW (ref 26.6–33.0)
MCHC: 30.6 g/dL — ABNORMAL LOW (ref 31.5–35.7)
MCV: 75 fL — ABNORMAL LOW (ref 79–97)
Monocytes Absolute: 0.5 10*3/uL (ref 0.1–0.9)
Monocytes: 7 %
Neutrophils Absolute: 3.7 10*3/uL (ref 1.4–7.0)
Neutrophils: 47 %
Platelets: 393 10*3/uL (ref 150–450)
RBC: 3.54 x10E6/uL — ABNORMAL LOW (ref 3.77–5.28)
RDW: 14.6 % (ref 11.7–15.4)
WBC: 7.9 10*3/uL (ref 3.4–10.8)

## 2022-10-04 LAB — LIPID PANEL
Chol/HDL Ratio: 3.4 ratio (ref 0.0–4.4)
Cholesterol, Total: 216 mg/dL — ABNORMAL HIGH (ref 100–199)
HDL: 64 mg/dL (ref >39)
LDL Chol Calc (NIH): 135 mg/dL — ABNORMAL HIGH (ref 0–99)
Triglycerides: 96 mg/dL (ref 0–149)
VLDL Cholesterol Cal: 17 mg/dL (ref 5–40)

## 2022-10-04 LAB — HEMOGLOBIN A1C
Est. average glucose Bld gHb Est-mCnc: 126 mg/dL
Hgb A1c MFr Bld: 6 % — ABNORMAL HIGH (ref 4.8–5.6)

## 2022-10-04 LAB — VITAMIN D 25 HYDROXY (VIT D DEFICIENCY, FRACTURES): Vit D, 25-Hydroxy: 18.1 ng/mL — ABNORMAL LOW (ref 30.0–100.0)

## 2022-10-06 ENCOUNTER — Encounter: Payer: Self-pay | Admitting: Internal Medicine

## 2022-10-14 ENCOUNTER — Encounter: Payer: Self-pay | Admitting: *Deleted

## 2022-12-16 ENCOUNTER — Other Ambulatory Visit: Payer: Self-pay | Admitting: Internal Medicine

## 2022-12-16 DIAGNOSIS — E890 Postprocedural hypothyroidism: Secondary | ICD-10-CM

## 2022-12-23 ENCOUNTER — Ambulatory Visit (INDEPENDENT_AMBULATORY_CARE_PROVIDER_SITE_OTHER): Payer: 59 | Admitting: Internal Medicine

## 2022-12-23 ENCOUNTER — Encounter: Payer: Self-pay | Admitting: Internal Medicine

## 2022-12-23 VITALS — BP 122/78 | HR 82 | Ht 67.5 in | Wt 230.0 lb

## 2022-12-23 DIAGNOSIS — E559 Vitamin D deficiency, unspecified: Secondary | ICD-10-CM

## 2022-12-23 DIAGNOSIS — R109 Unspecified abdominal pain: Secondary | ICD-10-CM

## 2022-12-23 DIAGNOSIS — E890 Postprocedural hypothyroidism: Secondary | ICD-10-CM | POA: Diagnosis not present

## 2022-12-23 DIAGNOSIS — D509 Iron deficiency anemia, unspecified: Secondary | ICD-10-CM | POA: Diagnosis not present

## 2022-12-23 MED ORDER — METHOCARBAMOL 500 MG PO TABS
500.0000 mg | ORAL_TABLET | Freq: Every day | ORAL | 0 refills | Status: DC
Start: 2022-12-23 — End: 2023-10-05

## 2022-12-23 NOTE — Progress Notes (Signed)
Date:  12/23/2022   Name:  Kimberly Atkinson   DOB:  1975-09-21   MRN:  [id number]   Chief Complaint: Back Pain (Left lower back. Burning sensation, DULL. Happens first thing in the morning, intermittent. )  Flank Pain This is a new problem. The current episode started more than 1 month ago. The problem occurs daily. The problem is unchanged. Pertinent negatives include no chest pain, fever, headaches or pelvic pain.  Loss of Consciousness Chronicity: one episode of lightheadedness. The problem has been resolved. There was no loss of consciousness. The symptoms are aggravated by standing. Associated symptoms include light-headedness. Pertinent negatives include no chest pain, diaphoresis, dizziness, fever, headaches or palpitations.  Anemia Presents for follow-up visit. Symptoms include light-headedness. There has been no fever or palpitations.  Thyroid Problem Presents for follow-up visit. Patient reports no anxiety, diaphoresis, fatigue or palpitations.    Lab Results  Component Value Date   NA 136 10/03/2022   K 4.2 10/03/2022   CO2 22 10/03/2022   GLUCOSE 99 10/03/2022   BUN 5 (L) 10/03/2022   CREATININE 0.82 10/03/2022   CALCIUM 9.7 10/03/2022   EGFR 89 10/03/2022   GFRNONAA 92 12/05/2019   Lab Results  Component Value Date   CHOL 216 (H) 10/03/2022   HDL 64 10/03/2022   LDLCALC 135 (H) 10/03/2022   TRIG 96 10/03/2022   CHOLHDL 3.4 10/03/2022   Lab Results  Component Value Date   TSH 27.800 (H) 10/03/2022   Lab Results  Component Value Date   HGBA1C 6.0 (H) 10/03/2022   Lab Results  Component Value Date   WBC 7.9 10/03/2022   HGB 8.1 (L) 10/03/2022   HCT 26.5 (L) 10/03/2022   MCV 75 (L) 10/03/2022   PLT 393 10/03/2022   Lab Results  Component Value Date   ALT 20 10/03/2022   AST 25 10/03/2022   ALKPHOS 57 10/03/2022   BILITOT 0.3 10/03/2022   Lab Results  Component Value Date   VD25OH 18.1 (L) 10/03/2022     Review of Systems  Constitutional:   Negative for appetite change, diaphoresis, fatigue, fever and unexpected weight change.  Respiratory:  Negative for chest tightness and shortness of breath.   Cardiovascular:  Positive for syncope. Negative for chest pain and palpitations.  Genitourinary:  Positive for flank pain. Negative for pelvic pain and urgency.  Neurological:  Positive for light-headedness. Negative for dizziness and headaches.  Psychiatric/Behavioral:  Negative for dysphoric mood and sleep disturbance. The patient is not nervous/anxious.     Patient Active Problem List   Diagnosis Date Noted   Status post total hysterectomy 10/23/2021   Migraine with aura and without status migrainosus, not intractable 05/28/2021   Pre-diabetes 12/05/2019   Vitamin D deficiency 12/05/2019   Postablative hypothyroidism 12/05/2019   Anemia 12/05/2019   B12 nutritional deficiency 12/05/2019   Hidradenitis axillaris 11/18/2011    Allergies  Allergen Reactions   Topamax [Topiramate] Other (See Comments)    Strange dreams    Past Surgical History:  Procedure Laterality Date   BREAST BIOPSY Left 2018   fibroadenoma   LAPAROSCOPY  08/28/2011   Procedure: LAPAROSCOPY OPERATIVE;  Surgeon: Serita Kyle, MD;  Location: [facility];  Service: Gynecology;  Laterality: Right;  Fugeration of Endometriosis, Removal of Left Paratubal Cyst./ Chromopertubation   TOTAL ABDOMINAL HYSTERECTOMY      Social History   Tobacco Use   Smoking status: Never   Smokeless tobacco: Never  Vaping Use   Vaping  Use: Never used  Substance Use Topics   Alcohol use: No   Drug use: No     Medication list has been reviewed and updated.  Current Meds  Medication Sig   cyanocobalamin 100 MCG tablet Take 100 mcg by mouth daily.   FeFum-FePoly-FA-B Cmp-C-Biot (INTEGRA PLUS) CAPS Take 1 each by mouth 3 (three) times a week.   ibuprofen (ADVIL) 800 MG tablet Take 1 tablet (800 mg total) by mouth every 8 (eight) hours as needed. menstrual cramps    levothyroxine (SYNTHROID) 125 MCG tablet Take 1 tablet (125 mcg total) by mouth daily.   methocarbamol (ROBAXIN) 500 MG tablet Take 1 tablet (500 mg total) by mouth at bedtime.   Multiple Vitamins-Minerals (MULTIVITAMINS THER. W/MINERALS) TABS Take 1 tablet by mouth daily.   ondansetron (ZOFRAN) 4 MG tablet Take 1 tablet (4 mg total) by mouth every 8 (eight) hours as needed for nausea or vomiting.   Rimegepant Sulfate (NURTEC) 75 MG TBDP Take 1 tablet (75 mg total) by mouth every other day.   rizatriptan (MAXALT-MLT) 10 MG disintegrating tablet Take 1 tablet (10 mg total) by mouth as needed for migraine. May repeat in 2 hours if needed. Do not exceed more than 2 tablets in a 24 hour period.   Vitamin D, Ergocalciferol, 50000 units CAPS Take 1 capsule by mouth once a week.       10/03/2022   10:10 AM 09/30/2021   10:12 AM 08/16/2021    8:12 AM 05/28/2021    2:13 PM  GAD 7 : Generalized Anxiety Score  Nervous, Anxious, on Edge 0 0 0 0  Control/stop worrying 0 0 0 0  Worry too much - different things 0 0 0 0  Trouble relaxing 0 0 0 0  Restless 0 0 0 0  Easily annoyed or irritable 0 0 0 0  Afraid - awful might happen 0 0 0 0  Total GAD 7 Score 0 0 0 0  Anxiety Difficulty Not difficult at all Not difficult at all  Not difficult at all       10/03/2022   10:09 AM 09/30/2021   10:11 AM 08/16/2021    8:12 AM  Depression screen PHQ 2/9  Decreased Interest 0 0 0  Down, Depressed, Hopeless 0 0 0  PHQ - 2 Score 0 0 0  Altered sleeping 0 0 0  Tired, decreased energy 0 0 0  Change in appetite 0 0 0  Feeling bad or failure about yourself  0 0 0  Trouble concentrating 0 0 0  Moving slowly or fidgety/restless 0 0 0  Suicidal thoughts  0 0  PHQ-9 Score 0 0 0  Difficult doing work/chores Not difficult at all Not difficult at all     BP Readings from Last 3 Encounters:  12/23/22 122/78  10/03/22 120/78  09/30/21 118/74    Physical Exam Vitals and nursing note reviewed.  Constitutional:       General: She is not in acute distress.    Appearance: Normal appearance. She is well-developed.  HENT:     Head: Normocephalic and atraumatic.  Cardiovascular:     Rate and Rhythm: Normal rate and regular rhythm.  Pulmonary:     Effort: Pulmonary effort is normal. No respiratory distress.     Breath sounds: No wheezing or rhonchi.  Musculoskeletal:       Arms:     Cervical back: Normal range of motion.     Right lower leg: No edema.  Left lower leg: No edema.     Comments: Muscle spasm  Lymphadenopathy:     Cervical: No cervical adenopathy.  Skin:    General: Skin is warm and dry.     Findings: No rash.  Neurological:     Mental Status: She is alert and oriented to person, place, and time.  Psychiatric:        Mood and Affect: Mood normal.        Behavior: Behavior normal.     Wt Readings from Last 3 Encounters:  12/23/22 230 lb (104.3 kg)  10/03/22 237 lb (107.5 kg)  09/30/21 234 lb (106.1 kg)    BP 122/78   Pulse 82   Ht 5' 7.5" (1.715 m)   Wt 230 lb (104.3 kg)   SpO2 100%   BMI 35.49 kg/m   Assessment and Plan:  Problem List Items Addressed This Visit     Vitamin D deficiency    On daily supplement      Relevant Orders   VITAMIN D 25 Hydroxy (Vit-D Deficiency, Fractures)   Postablative hypothyroidism    Labs improved.      Relevant Orders   TSH + free T4   Anemia    Back on iron and B12 for the past 3 months. Had episode of pre-syncope      Relevant Orders   CBC with Differential/Platelet   Iron, TIBC and Ferritin Panel   Other Visit Diagnoses     Left flank pain    -  Primary   Relevant Medications   methocarbamol (ROBAXIN) 500 MG tablet       No follow-ups on file.   Partially dictated using Dragon software, any errors are not intentional.  Reubin Milan, MD [facility] [place], [place]

## 2022-12-23 NOTE — Assessment & Plan Note (Signed)
Labs improved

## 2022-12-23 NOTE — Assessment & Plan Note (Signed)
On daily supplement 

## 2022-12-23 NOTE — Assessment & Plan Note (Signed)
Back on iron and B12 for the past 3 months. Had episode of pre-syncope

## 2022-12-24 ENCOUNTER — Other Ambulatory Visit: Payer: Self-pay | Admitting: Internal Medicine

## 2022-12-24 DIAGNOSIS — E890 Postprocedural hypothyroidism: Secondary | ICD-10-CM

## 2022-12-24 LAB — CBC WITH DIFFERENTIAL/PLATELET
Basophils Absolute: 0.1 10*3/uL (ref 0.0–0.2)
Basos: 1 %
EOS (ABSOLUTE): 0.3 10*3/uL (ref 0.0–0.4)
Eos: 3 %
Hematocrit: 31.7 % — ABNORMAL LOW (ref 34.0–46.6)
Hemoglobin: 9.1 g/dL — ABNORMAL LOW (ref 11.1–15.9)
Immature Grans (Abs): 0 10*3/uL (ref 0.0–0.1)
Immature Granulocytes: 0 %
Lymphocytes Absolute: 3 10*3/uL (ref 0.7–3.1)
Lymphs: 36 %
MCH: 21.4 pg — ABNORMAL LOW (ref 26.6–33.0)
MCHC: 28.7 g/dL — ABNORMAL LOW (ref 31.5–35.7)
MCV: 75 fL — ABNORMAL LOW (ref 79–97)
Monocytes Absolute: 0.6 10*3/uL (ref 0.1–0.9)
Monocytes: 7 %
Neutrophils Absolute: 4.4 10*3/uL (ref 1.4–7.0)
Neutrophils: 53 %
Platelets: 399 10*3/uL (ref 150–450)
RBC: 4.25 x10E6/uL (ref 3.77–5.28)
RDW: 17.1 % — ABNORMAL HIGH (ref 11.7–15.4)
WBC: 8.4 10*3/uL (ref 3.4–10.8)

## 2022-12-24 LAB — TSH+FREE T4
Free T4: 0.83 ng/dL (ref 0.82–1.77)
TSH: 13.5 u[IU]/mL — ABNORMAL HIGH (ref 0.450–4.500)

## 2022-12-24 LAB — IRON,TIBC AND FERRITIN PANEL
Ferritin: 38 ng/mL (ref 15–150)
Iron Saturation: 8 % — CL (ref 15–55)
Iron: 31 ug/dL (ref 27–159)
Total Iron Binding Capacity: 393 ug/dL (ref 250–450)
UIBC: 362 ug/dL (ref 131–425)

## 2022-12-24 LAB — VITAMIN D 25 HYDROXY (VIT D DEFICIENCY, FRACTURES): Vit D, 25-Hydroxy: 28.9 ng/mL — ABNORMAL LOW (ref 30.0–100.0)

## 2022-12-24 MED ORDER — LEVOTHYROXINE SODIUM 137 MCG PO TABS
137.0000 ug | ORAL_TABLET | Freq: Every day | ORAL | 0 refills | Status: DC
Start: 2022-12-24 — End: 2023-05-14

## 2023-02-04 ENCOUNTER — Encounter: Payer: Self-pay | Admitting: Internal Medicine

## 2023-02-04 ENCOUNTER — Ambulatory Visit (INDEPENDENT_AMBULATORY_CARE_PROVIDER_SITE_OTHER): Payer: 59 | Admitting: Internal Medicine

## 2023-02-04 ENCOUNTER — Other Ambulatory Visit: Payer: Self-pay | Admitting: Internal Medicine

## 2023-02-04 VITALS — BP 128/70 | HR 81 | Ht 67.5 in | Wt 231.0 lb

## 2023-02-04 DIAGNOSIS — E890 Postprocedural hypothyroidism: Secondary | ICD-10-CM

## 2023-02-04 DIAGNOSIS — E559 Vitamin D deficiency, unspecified: Secondary | ICD-10-CM

## 2023-02-04 DIAGNOSIS — D509 Iron deficiency anemia, unspecified: Secondary | ICD-10-CM | POA: Diagnosis not present

## 2023-02-04 DIAGNOSIS — G43109 Migraine with aura, not intractable, without status migrainosus: Secondary | ICD-10-CM | POA: Diagnosis not present

## 2023-02-04 MED ORDER — NURTEC 75 MG PO TBDP
1.0000 | ORAL_TABLET | ORAL | 5 refills | Status: AC
Start: 2023-02-04 — End: ?

## 2023-02-04 NOTE — Assessment & Plan Note (Signed)
Migraines responding well to every other day Nurtec 75 mg.

## 2023-02-04 NOTE — Assessment & Plan Note (Addendum)
Lab Results  Component Value Date   TSH 13.500 (H) 12/23/2022  Dose increased last visit to 137 mcg. Will check labs - consider prescribing DAW synthroid

## 2023-02-04 NOTE — Progress Notes (Signed)
Date:  02/04/2023   Name:  Kimberly Atkinson   DOB:  [birth year]   MRN:  [id number]   Chief Complaint: Anemia (/), Vitamin D Deficency, and Hypothyroidism  Anemia Presents for follow-up visit. There has been no abdominal pain, leg swelling, light-headedness, malaise/fatigue, palpitations or weight loss. Signs of blood loss that are not present include hematemesis and menorrhagia. There are no compliance problems.   Thyroid Problem Presents for follow-up visit. Patient reports no constipation, diarrhea, fatigue, palpitations or weight loss.  Migraine  This is a recurrent problem. The problem occurs intermittently. The pain quality is similar to prior headaches. Pertinent negatives include no abdominal pain, coughing, dizziness, weakness or weight loss. Treatments tried: Nurtec qod for prevention.  Vitamin D def - high dose supplement started last visit.   Lab Results  Component Value Date   NA 136 10/03/2022   K 4.2 10/03/2022   CO2 22 10/03/2022   GLUCOSE 99 10/03/2022   BUN 5 (L) 10/03/2022   CREATININE 0.82 10/03/2022   CALCIUM 9.7 10/03/2022   EGFR 89 10/03/2022   GFRNONAA 92 12/05/2019   Lab Results  Component Value Date   CHOL 216 (H) 10/03/2022   HDL 64 10/03/2022   LDLCALC 135 (H) 10/03/2022   TRIG 96 10/03/2022   CHOLHDL 3.4 10/03/2022   Lab Results  Component Value Date   TSH 13.500 (H) 12/23/2022   Lab Results  Component Value Date   HGBA1C 6.0 (H) 10/03/2022   Lab Results  Component Value Date   WBC 8.4 12/23/2022   HGB 9.1 (L) 12/23/2022   HCT 31.7 (L) 12/23/2022   MCV 75 (L) 12/23/2022   PLT 399 12/23/2022   Lab Results  Component Value Date   ALT 20 10/03/2022   AST 25 10/03/2022   ALKPHOS 57 10/03/2022   BILITOT 0.3 10/03/2022   Lab Results  Component Value Date   VD25OH 28.9 (L) 12/23/2022     Review of Systems  Constitutional:  Negative for fatigue, malaise/fatigue, unexpected weight change and weight loss.  HENT:  Negative for  nosebleeds.   Eyes:  Negative for visual disturbance.  Respiratory:  Negative for cough, chest tightness, shortness of breath and wheezing.   Cardiovascular:  Negative for chest pain, palpitations and leg swelling.  Gastrointestinal:  Negative for abdominal pain, constipation, diarrhea and hematemesis.  Genitourinary:  Negative for menorrhagia.  Neurological:  Positive for headaches. Negative for dizziness, weakness and light-headedness.    Patient Active Problem List   Diagnosis Date Noted   Status post total hysterectomy 10/23/2021   Migraine with aura and without status migrainosus, not intractable 05/28/2021   Pre-diabetes 12/05/2019   Vitamin D deficiency 12/05/2019   Postablative hypothyroidism 12/05/2019   Iron deficiency anemia 12/05/2019   B12 nutritional deficiency 12/05/2019   Hidradenitis axillaris 11/18/2011    Allergies  Allergen Reactions   Topamax [Topiramate] Other (See Comments)    Strange dreams    Past Surgical History:  Procedure Laterality Date   BREAST BIOPSY Left 2018   fibroadenoma   LAPAROSCOPY  08/28/2011   Procedure: LAPAROSCOPY OPERATIVE;  Surgeon: Serita Kyle, MD;  Location: [facility];  Service: Gynecology;  Laterality: Right;  Fugeration of Endometriosis, Removal of Left Paratubal Cyst./ Chromopertubation   TOTAL ABDOMINAL HYSTERECTOMY      Social History   Tobacco Use   Smoking status: Never   Smokeless tobacco: Never  Vaping Use   Vaping status: Never Used  Substance Use Topics   Alcohol  use: No   Drug use: No     Medication list has been reviewed and updated.  Current Meds  Medication Sig   cyanocobalamin 100 MCG tablet Take 100 mcg by mouth daily.   FeFum-FePoly-FA-B Cmp-C-Biot (INTEGRA PLUS) CAPS Take 1 each by mouth 3 (three) times a week. (Patient taking differently: Take 1 each by mouth daily.)   ibuprofen (ADVIL) 800 MG tablet Take 1 tablet (800 mg total) by mouth every 8 (eight) hours as needed. menstrual cramps    levothyroxine (SYNTHROID) 137 MCG tablet Take 1 tablet (137 mcg total) by mouth daily.   methocarbamol (ROBAXIN) 500 MG tablet Take 1 tablet (500 mg total) by mouth at bedtime.   Multiple Vitamins-Minerals (MULTIVITAMINS THER. W/MINERALS) TABS Take 1 tablet by mouth daily.   ondansetron (ZOFRAN) 4 MG tablet Take 1 tablet (4 mg total) by mouth every 8 (eight) hours as needed for nausea or vomiting.   Vitamin D, Ergocalciferol, 50000 units CAPS Take 1 capsule by mouth once a week.   [DISCONTINUED] Rimegepant Sulfate (NURTEC) 75 MG TBDP Take 1 tablet (75 mg total) by mouth every other day.   [DISCONTINUED] rizatriptan (MAXALT-MLT) 10 MG disintegrating tablet Take 1 tablet (10 mg total) by mouth as needed for migraine. May repeat in 2 hours if needed. Do not exceed more than 2 tablets in a 24 hour period.       02/04/2023    3:12 PM 10/03/2022   10:10 AM 09/30/2021   10:12 AM 08/16/2021    8:12 AM  GAD 7 : Generalized Anxiety Score  Nervous, Anxious, on Edge 0 0 0 0  Control/stop worrying 0 0 0 0  Worry too much - different things 0 0 0 0  Trouble relaxing 0 0 0 0  Restless 0 0 0 0  Easily annoyed or irritable 0 0 0 0  Afraid - awful might happen 0 0 0 0  Total GAD 7 Score 0 0 0 0  Anxiety Difficulty Not difficult at all Not difficult at all Not difficult at all        02/04/2023    3:12 PM 10/03/2022   10:09 AM 09/30/2021   10:11 AM  Depression screen PHQ 2/9  Decreased Interest 0 0 0  Down, Depressed, Hopeless 0 0 0  PHQ - 2 Score 0 0 0  Altered sleeping 0 0 0  Tired, decreased energy 0 0 0  Change in appetite 0 0 0  Feeling bad or failure about yourself  0 0 0  Trouble concentrating 0 0 0  Moving slowly or fidgety/restless 0 0 0  Suicidal thoughts 0  0  PHQ-9 Score 0 0 0  Difficult doing work/chores Not difficult at all Not difficult at all Not difficult at all    BP Readings from Last 3 Encounters:  02/04/23 128/70  12/23/22 122/78  10/03/22 120/78    Physical  Exam Vitals and nursing note reviewed.  Constitutional:      General: She is not in acute distress.    Appearance: She is well-developed.  HENT:     Head: Normocephalic and atraumatic.  Neck:     Vascular: No carotid bruit.  Cardiovascular:     Rate and Rhythm: Normal rate and regular rhythm.     Pulses: Normal pulses.  Pulmonary:     Effort: Pulmonary effort is normal. No respiratory distress.     Breath sounds: No wheezing or rhonchi.  Musculoskeletal:        General: Swelling (  trace ankle edema) present.     Cervical back: Normal range of motion.  Lymphadenopathy:     Cervical: No cervical adenopathy.  Skin:    General: Skin is warm and dry.     Findings: No rash.  Neurological:     Mental Status: She is alert and oriented to person, place, and time.  Psychiatric:        Mood and Affect: Mood normal.        Behavior: Behavior normal.     Wt Readings from Last 3 Encounters:  02/04/23 231 lb (104.8 kg)  12/23/22 230 lb (104.3 kg)  10/03/22 237 lb (107.5 kg)    BP 128/70   Pulse 81   Ht 5' 7.5" (1.715 m)   Wt 231 lb (104.8 kg)   LMP 01/24/2023   SpO2 98%   BMI 35.65 kg/m   Assessment and Plan:  Problem List Items Addressed This Visit     Vitamin D deficiency    Vitamin D low last check. High dose weekly supplement ordered.      Relevant Orders   VITAMIN D 25 Hydroxy (Vit-D Deficiency, Fractures)   Postablative hypothyroidism    Lab Results  Component Value Date   TSH 13.500 (H) 12/23/2022  Dose increased last visit to 137 mcg. Will check labs - consider prescribing DAW synthroid      Relevant Orders   TSH + free T4   Migraine with aura and without status migrainosus, not intractable    Migraines responding well to every other day Nurtec 75 mg.       Relevant Medications   Rimegepant Sulfate (NURTEC) 75 MG TBDP   Iron deficiency anemia - Primary    Energy improved since resuming iron supplement Recommend continuing supplement daily  indefinitely      Relevant Orders   CBC with Differential/Platelet   Iron, TIBC and Ferritin Panel    No follow-ups on file.    Reubin Milan, MD [facility] [place]

## 2023-02-04 NOTE — Assessment & Plan Note (Signed)
Energy improved since resuming iron supplement Recommend continuing supplement daily indefinitely

## 2023-02-04 NOTE — Assessment & Plan Note (Signed)
Vitamin D low last check. High dose weekly supplement ordered.

## 2023-02-05 LAB — IRON,TIBC AND FERRITIN PANEL
Ferritin: 19 ng/mL (ref 15–150)
Iron Saturation: >96 % (ref 15–55)
Iron: 362 ug/dL (ref 27–159)
Total Iron Binding Capacity: <379 ug/dL (ref 250–450)
UIBC: <17 ug/dL — ABNORMAL LOW (ref 131–425)

## 2023-02-05 LAB — CBC WITH DIFFERENTIAL/PLATELET
Basophils Absolute: 0.1 10*3/uL (ref 0.0–0.2)
Basos: 1 %
EOS (ABSOLUTE): 0.3 10*3/uL (ref 0.0–0.4)
Eos: 3 %
Hematocrit: 29.4 % — ABNORMAL LOW (ref 34.0–46.6)
Hemoglobin: 8.6 g/dL — ABNORMAL LOW (ref 11.1–15.9)
Immature Grans (Abs): 0 10*3/uL (ref 0.0–0.1)
Immature Granulocytes: 1 %
Lymphocytes Absolute: 2.8 10*3/uL (ref 0.7–3.1)
Lymphs: 32 %
MCH: 22 pg — ABNORMAL LOW (ref 26.6–33.0)
MCHC: 29.3 g/dL — ABNORMAL LOW (ref 31.5–35.7)
MCV: 75 fL — ABNORMAL LOW (ref 79–97)
Monocytes Absolute: 0.5 10*3/uL (ref 0.1–0.9)
Monocytes: 6 %
Neutrophils Absolute: 5.1 10*3/uL (ref 1.4–7.0)
Neutrophils: 57 %
Platelets: 440 10*3/uL (ref 150–450)
RBC: 3.91 x10E6/uL (ref 3.77–5.28)
RDW: 16.2 % — ABNORMAL HIGH (ref 11.7–15.4)
WBC: 8.8 10*3/uL (ref 3.4–10.8)

## 2023-02-05 LAB — TSH+FREE T4
Free T4: 1.34 ng/dL (ref 0.82–1.77)
TSH: 2.25 u[IU]/mL (ref 0.450–4.500)

## 2023-02-05 LAB — VITAMIN D 25 HYDROXY (VIT D DEFICIENCY, FRACTURES): Vit D, 25-Hydroxy: 25.2 ng/mL — ABNORMAL LOW (ref 30.0–100.0)

## 2023-02-06 ENCOUNTER — Telehealth: Payer: Self-pay | Admitting: Internal Medicine

## 2023-02-06 NOTE — Telephone Encounter (Signed)
Copied from CRM [id number]. Topic: General - Other >> Feb 06, 2023 10:05 AM Kimberly Atkinson wrote: Patient called to get the results of her recent lab work. Please advise.

## 2023-02-09 ENCOUNTER — Other Ambulatory Visit: Payer: Self-pay

## 2023-02-09 DIAGNOSIS — D649 Anemia, unspecified: Secondary | ICD-10-CM

## 2023-04-03 ENCOUNTER — Ambulatory Visit: Payer: 59 | Admitting: Internal Medicine

## 2023-04-13 ENCOUNTER — Telehealth: Payer: Self-pay

## 2023-04-13 NOTE — Telephone Encounter (Signed)
Completed PA on [email or web address] for Nurtec 75 mg 16 tablets in 30 days.  (Key: [id number]) PA Case ID #: [id number]  Awaiting outcome.  - Jennel Mara

## 2023-04-17 NOTE — Telephone Encounter (Signed)
PA approved until approved through 04/12/2024.  - Kimberly Atkinson

## 2023-05-14 ENCOUNTER — Other Ambulatory Visit: Payer: Self-pay | Admitting: Internal Medicine

## 2023-05-14 DIAGNOSIS — E890 Postprocedural hypothyroidism: Secondary | ICD-10-CM

## 2023-07-13 ENCOUNTER — Other Ambulatory Visit: Payer: Self-pay

## 2023-07-13 DIAGNOSIS — D509 Iron deficiency anemia, unspecified: Secondary | ICD-10-CM

## 2023-07-13 DIAGNOSIS — D649 Anemia, unspecified: Secondary | ICD-10-CM

## 2023-07-24 ENCOUNTER — Inpatient Hospital Stay: Payer: 59

## 2023-07-24 ENCOUNTER — Encounter: Payer: Self-pay | Admitting: Internal Medicine

## 2023-07-24 ENCOUNTER — Inpatient Hospital Stay: Payer: 59 | Attending: Internal Medicine | Admitting: Internal Medicine

## 2023-07-24 VITALS — BP 156/85 | HR 88 | Temp 99.5°F | Resp 18 | Wt 229.0 lb

## 2023-07-24 DIAGNOSIS — R7303 Prediabetes: Secondary | ICD-10-CM | POA: Diagnosis not present

## 2023-07-24 DIAGNOSIS — D509 Iron deficiency anemia, unspecified: Secondary | ICD-10-CM | POA: Insufficient documentation

## 2023-07-24 DIAGNOSIS — N920 Excessive and frequent menstruation with regular cycle: Secondary | ICD-10-CM | POA: Diagnosis present

## 2023-07-24 DIAGNOSIS — E538 Deficiency of other specified B group vitamins: Secondary | ICD-10-CM

## 2023-07-24 DIAGNOSIS — F419 Anxiety disorder, unspecified: Secondary | ICD-10-CM | POA: Diagnosis not present

## 2023-07-24 LAB — CBC WITH DIFFERENTIAL/PLATELET
Abs Immature Granulocytes: 0.02 10*3/uL (ref 0.00–0.07)
Basophils Absolute: 0.1 10*3/uL (ref 0.0–0.1)
Basophils Relative: 1 %
Eosinophils Absolute: 0.4 10*3/uL (ref 0.0–0.5)
Eosinophils Relative: 5 %
HCT: 27.5 % — ABNORMAL LOW (ref 36.0–46.0)
Hemoglobin: 8 g/dL — ABNORMAL LOW (ref 12.0–15.0)
Immature Granulocytes: 0 %
Lymphocytes Relative: 41 %
Lymphs Abs: 3.2 10*3/uL (ref 0.7–4.0)
MCH: 19.8 pg — ABNORMAL LOW (ref 26.0–34.0)
MCHC: 29.1 g/dL — ABNORMAL LOW (ref 30.0–36.0)
MCV: 68.1 fL — ABNORMAL LOW (ref 80.0–100.0)
Monocytes Absolute: 0.5 10*3/uL (ref 0.1–1.0)
Monocytes Relative: 6 %
Neutro Abs: 3.6 10*3/uL (ref 1.7–7.7)
Neutrophils Relative %: 47 %
Platelets: 397 10*3/uL (ref 150–400)
RBC: 4.04 MIL/uL (ref 3.87–5.11)
RDW: 18.4 % — ABNORMAL HIGH (ref 11.5–15.5)
Smear Review: NORMAL
WBC: 7.8 10*3/uL (ref 4.0–10.5)
nRBC: 0 % (ref 0.0–0.2)

## 2023-07-24 LAB — IRON AND TIBC
Iron: 28 ug/dL (ref 28–170)
Saturation Ratios: 6 % — ABNORMAL LOW (ref 10.4–31.8)
TIBC: 504 ug/dL — ABNORMAL HIGH (ref 250–450)
UIBC: 476 ug/dL

## 2023-07-24 LAB — FERRITIN: Ferritin: 4 ng/mL — ABNORMAL LOW (ref 11–307)

## 2023-07-24 NOTE — Progress Notes (Signed)
 [facility]  [phone] [phone]  ID: Kimberly Atkinson OB: Mar 03, 1976  MR#: [id number]  RDW#:[id number]  Patient Care Team: Justus Leita DEL, MD as PCP - General (Internal Medicine) Rutherford Gain, MD as Consulting Physician (Obstetrics and Gynecology) Clista Bimler, MD as Consulting Physician (Oncology)  REFERRING PROVIDER: Dr. Leita Justus  REASON FOR REFERRAL: Iron  deficiency anemia  HPI: Kimberly Atkinson is a 48 y.o. female with past medical history of iron  deficiency anemia, prediabetes, anxiety referred to hematology for IDA.  Patient has longstanding history of iron  deficiency anemia.  She reports heavy menstrual cycles on the first 3 days where she has to use extra large pad and changes 6-7 times a day.  Ongoing for past 3 to 4 years.  She last seen OB/GYN in 2022.  Has history of fibroids.  Reports in June 2024 she was shopping and blacked out and fainted.  She was previously on oral iron .  Was advised to stop in July 2024 by PCP based on her blood work.  No history of gastric bypass surgery.  Occasional use of ibuprofen  with menstrual cycle.  No colonoscopy or endoscopy in the past.  Energy level is fair.  REVIEW OF SYSTEMS:   ROS  As per HPI. Otherwise, a complete review of systems is negative.  PAST MEDICAL HISTORY: Past Medical History:  Diagnosis Date   Anemia    Takes iron  and folic acid   Anxiety    Diabetes mellitus without complication (HCC)    Pre- diabetic   GERD (gastroesophageal reflux disease)    no meds at present   Headache    Hypothyroidism    Insomnia    Piriformis syndrome    Situational depression    Vitamin B deficiency    Vitamin D  deficiency     PAST SURGICAL HISTORY: Past Surgical History:  Procedure Laterality Date   BREAST BIOPSY Left 2018   fibroadenoma   LAPAROSCOPY  08/28/2011   Procedure: LAPAROSCOPY OPERATIVE;  Surgeon: Gain DELENA Rutherford, MD;  Location: [facility];  Service:  Gynecology;  Laterality: Right;  Fugeration of Endometriosis, Removal of Left Paratubal Cyst./ Chromopertubation   TOTAL ABDOMINAL HYSTERECTOMY      FAMILY HISTORY: Family History  Problem Relation Age of Onset   Diabetes Mother    Hypertension Mother    Hyperlipidemia Mother     HEALTH MAINTENANCE: Social History   Tobacco Use   Smoking status: Never   Smokeless tobacco: Never  Vaping Use   Vaping status: Never Used  Substance Use Topics   Alcohol use: No   Drug use: No     Allergies  Allergen Reactions   Topamax [Topiramate] Other (See Comments)    Strange dreams    Current Outpatient Medications  Medication Sig Dispense Refill   cyanocobalamin  100 MCG tablet Take 100 mcg by mouth daily.     ibuprofen  (ADVIL ) 800 MG tablet Take 1 tablet (800 mg total) by mouth every 8 (eight) hours as needed. menstrual cramps 100 tablet 0   levothyroxine  (SYNTHROID ) 137 MCG tablet Take 1 tablet (137 mcg total) by mouth daily. 90 tablet 3   Multiple Vitamins-Minerals (MULTIVITAMINS THER. W/MINERALS) TABS Take 1 tablet by mouth daily.     ondansetron  (ZOFRAN ) 4 MG tablet Take 1 tablet (4 mg total) by mouth every 8 (eight) hours as needed for nausea or vomiting. 20 tablet 0   Rimegepant Sulfate (NURTEC) 75 MG TBDP Take 1 tablet (75 mg total) by mouth every other day.  16 tablet 5   Vitamin D , Ergocalciferol , 50000 units CAPS Take 1 capsule by mouth once a week. 12 capsule 3   FeFum-FePoly-FA-B Cmp-C-Biot (INTEGRA PLUS ) CAPS Take 1 each by mouth 3 (three) times a week. (Patient not taking: Reported on 07/24/2023) 90 capsule 0   methocarbamol  (ROBAXIN ) 500 MG tablet Take 1 tablet (500 mg total) by mouth at bedtime. (Patient not taking: Reported on 07/24/2023) 30 tablet 0   No current facility-administered medications for this visit.    OBJECTIVE: Vitals:   07/24/23 1412  BP: (!) 156/85  Pulse: 88  Resp: 18  Temp: 99.5 F (37.5 C)  SpO2: 100%     Body mass index is 35.34 kg/m.       General: Well-developed, well-nourished, no acute distress. Eyes: Pink conjunctiva, anicteric sclera. HEENT: Normocephalic, moist mucous membranes, clear oropharnyx. Lungs: Clear to auscultation bilaterally. Heart: Regular rate and rhythm. No rubs, murmurs, or gallops. Abdomen: Soft, nontender, nondistended. No organomegaly noted, normoactive bowel sounds. Musculoskeletal: No edema, cyanosis, or clubbing. Neuro: Alert, answering all questions appropriately. Cranial nerves grossly intact. Skin: No rashes or petechiae noted. Psych: Normal affect. Lymphatics: No cervical, calvicular, axillary or inguinal LAD.   LAB RESULTS:  Lab Results  Component Value Date   NA 136 10/03/2022   K 4.2 10/03/2022   CL 103 10/03/2022   CO2 22 10/03/2022   GLUCOSE 99 10/03/2022   BUN 5 (L) 10/03/2022   CREATININE 0.82 10/03/2022   CALCIUM 9.7 10/03/2022   PROT 6.6 10/03/2022   ALBUMIN 4.2 10/03/2022   AST 25 10/03/2022   ALT 20 10/03/2022   ALKPHOS 57 10/03/2022   BILITOT 0.3 10/03/2022   GFRNONAA 92 12/05/2019   GFRAA 106 12/05/2019    Lab Results  Component Value Date   WBC 8.8 02/04/2023   NEUTROABS 5.1 02/04/2023   HGB 8.6 (L) 02/04/2023   HCT 29.4 (L) 02/04/2023   MCV 75 (L) 02/04/2023   PLT 440 02/04/2023    Lab Results  Component Value Date   TIBC <379 02/04/2023   TIBC 393 12/23/2022   TIBC 385 10/03/2022   FERRITIN 19 02/04/2023   FERRITIN 38 12/23/2022   FERRITIN 9 (L) 10/03/2022   IRONPCTSAT >96 (HH) 02/04/2023   IRONPCTSAT 8 (LL) 12/23/2022   IRONPCTSAT 4 (LL) 10/03/2022     STUDIES: No results found.  ASSESSMENT AND PLAN:   [name] is a 48 y.o. female with pmh of  iron  deficiency anemia, prediabetes, anxiety referred to hematology for IDA.  # Iron  deficiency anemia -Likely secondary to heavy menstrual cycles.  Cannot rule out occult GI bleed. -Will obtain CBC, iron  panel and ferritin. -Scheduled for IV Venofer  200 mg weekly x 5 doses.  Side  effects such as back pain, chest pain, nausea was discussed.  Patient is agreeable to proceed. -Referral to GI for endoscopy and colonoscopy for anemia. -Patient will also reach out to her OB/GYN with her heavy menstrual cycle.  She was last seen in 2022 per patient.  Has history of fibroids. -Will check celiac panel with her history of gluten insensitivity.  Orders Placed This Encounter  Procedures   CBC with Differential/Platelet   Iron  and TIBC   Ferritin   Celiac Disease Panel   Ambulatory referral to Gastroenterology   RTC in 4 months for MD visit, labs, possible Venofer   Patient expressed understanding and was in agreement with this plan. She also understands that She can call clinic at any time with any questions, concerns,  or complaints.   I spent a total of 45 minutes reviewing chart data, face-to-face evaluation with the patient, counseling and coordination of care as detailed above.  Brylinn Teaney, MD   07/24/2023 2:25 PM

## 2023-07-24 NOTE — Progress Notes (Signed)
 Patient started having some dizziness right after her appointment ion 7/172024, ongoing sometimes. More weak and fatigued as well, some tingling in her toes.  Patient is needing a new PCP asap, so I told her that we don't sent PCP referrals, but I am more than happy to print her out a paper with some available near here.

## 2023-07-27 ENCOUNTER — Encounter: Payer: Self-pay | Admitting: Internal Medicine

## 2023-07-28 ENCOUNTER — Encounter: Payer: Self-pay | Admitting: Internal Medicine

## 2023-07-28 LAB — CELIAC DISEASE PANEL
Endomysial Ab, IgA: NEGATIVE
IgA: 216 mg/dL (ref 87–352)
Tissue Transglutaminase Ab, IgA: <2 U/mL (ref 0–3)

## 2023-08-07 ENCOUNTER — Inpatient Hospital Stay: Payer: 59

## 2023-08-07 VITALS — BP 136/84 | HR 68 | Temp 99.0°F | Resp 17

## 2023-08-07 DIAGNOSIS — D509 Iron deficiency anemia, unspecified: Secondary | ICD-10-CM

## 2023-08-07 MED ORDER — IRON SUCROSE 20 MG/ML IV SOLN
200.0000 mg | Freq: Once | INTRAVENOUS | Status: AC
  Administered 2023-08-07: 200 mg via INTRAVENOUS
  Filled 2023-08-07: qty 10

## 2023-08-07 NOTE — Progress Notes (Signed)
Pt tolerated first Venofer well, VSSAF, ambulated independently at discharge. Declined AVS.

## 2023-08-07 NOTE — Patient Instructions (Signed)
 [facility] - A DEPT OF [facility]  Discharge Instructions: Thank you for choosing [facility] to provide your oncology and hematology care.  If you have a lab appointment with the Cancer Center, please go directly to the Cancer Center and check in at the registration area.  Wear comfortable clothing and clothing appropriate for easy access to any Portacath or PICC line.   We strive to give you quality time with your provider. You may need to reschedule your appointment if you arrive late (15 or more minutes).  Arriving late affects you and other patients whose appointments are after yours.  Also, if you miss three or more appointments without notifying the office, you may be dismissed from the clinic at the provider's discretion.      For prescription refill requests, have your pharmacy contact our office and allow 72 hours for refills to be completed.    Today you received the following chemotherapy and/or immunotherapy agents Venofer.      To help prevent nausea and vomiting after your treatment, we encourage you to take your nausea medication as directed.  BELOW ARE SYMPTOMS THAT SHOULD BE REPORTED IMMEDIATELY: *FEVER GREATER THAN 100.4 F (38 C) OR HIGHER *CHILLS OR SWEATING *NAUSEA AND VOMITING THAT IS NOT CONTROLLED WITH YOUR NAUSEA MEDICATION *UNUSUAL SHORTNESS OF BREATH *UNUSUAL BRUISING OR BLEEDING *URINARY PROBLEMS (pain or burning when urinating, or frequent urination) *BOWEL PROBLEMS (unusual diarrhea, constipation, pain near the anus) TENDERNESS IN MOUTH AND THROAT WITH OR WITHOUT PRESENCE OF ULCERS (sore throat, sores in mouth, or a toothache) UNUSUAL RASH, SWELLING OR PAIN  UNUSUAL VAGINAL DISCHARGE OR ITCHING   Items with * indicate a potential emergency and should be followed up as soon as possible or go to the Emergency Department if any problems should occur.  Please show the CHEMOTHERAPY ALERT CARD or IMMUNOTHERAPY  ALERT CARD at check-in to the Emergency Department and triage nurse.  Should you have questions after your visit or need to cancel or reschedule your appointment, please contact [facility] - A DEPT OF [facility]  [phone] and follow the prompts.  Office hours are 8:00 a.m. to 4:30 p.m. Monday - Friday. Please note that voicemails left after 4:00 p.m. may not be returned until the following business day.  We are closed weekends and major holidays. You have access to a nurse at all times for urgent questions. Please call the main number to the clinic [phone] and follow the prompts.  For any non-urgent questions, you may also contact your provider using MyChart. We now offer e-Visits for anyone 70 and older to request care online for non-urgent symptoms. For details visit [email or web address].   Also download the MyChart app! Go to the app store, search "MyChart", open the app, select [facility], and log in with your MyChart username and password.

## 2023-08-14 ENCOUNTER — Inpatient Hospital Stay: Payer: 59

## 2023-08-14 VITALS — BP 132/88 | HR 64 | Temp 98.6°F | Resp 19

## 2023-08-14 DIAGNOSIS — D509 Iron deficiency anemia, unspecified: Secondary | ICD-10-CM | POA: Diagnosis not present

## 2023-08-14 DIAGNOSIS — Z8742 Personal history of other diseases of the female genital tract: Secondary | ICD-10-CM | POA: Insufficient documentation

## 2023-08-14 DIAGNOSIS — Z9889 Other specified postprocedural states: Secondary | ICD-10-CM

## 2023-08-14 MED ORDER — IRON SUCROSE 20 MG/ML IV SOLN
200.0000 mg | Freq: Once | INTRAVENOUS | Status: AC
  Administered 2023-08-14: 200 mg via INTRAVENOUS

## 2023-08-14 MED ORDER — SODIUM CHLORIDE 0.9% FLUSH
10.0000 mL | Freq: Once | INTRAVENOUS | Status: AC | PRN
  Administered 2023-08-14: 10 mL
  Filled 2023-08-14: qty 10

## 2023-08-21 ENCOUNTER — Inpatient Hospital Stay: Payer: 59

## 2023-08-21 VITALS — BP 131/76 | HR 65 | Temp 98.5°F | Resp 18

## 2023-08-21 DIAGNOSIS — D509 Iron deficiency anemia, unspecified: Secondary | ICD-10-CM

## 2023-08-21 MED ORDER — IRON SUCROSE 20 MG/ML IV SOLN
200.0000 mg | Freq: Once | INTRAVENOUS | Status: AC
Start: 2023-08-21 — End: 2023-08-21
  Administered 2023-08-21: 200 mg via INTRAVENOUS
  Filled 2023-08-21: qty 10

## 2023-08-21 MED ORDER — SODIUM CHLORIDE 0.9% FLUSH
10.0000 mL | Freq: Once | INTRAVENOUS | Status: AC | PRN
  Administered 2023-08-21: 10 mL
  Filled 2023-08-21: qty 10

## 2023-08-21 NOTE — Progress Notes (Signed)
Pt has been educated and understands. Pt declined to stay the recommended 30 mins after iron infusion. VSS.

## 2023-08-28 ENCOUNTER — Inpatient Hospital Stay: Payer: 59 | Attending: Internal Medicine

## 2023-08-28 VITALS — BP 129/84 | HR 61 | Temp 96.5°F | Resp 18

## 2023-08-28 DIAGNOSIS — N920 Excessive and frequent menstruation with regular cycle: Secondary | ICD-10-CM | POA: Insufficient documentation

## 2023-08-28 DIAGNOSIS — D509 Iron deficiency anemia, unspecified: Secondary | ICD-10-CM | POA: Diagnosis present

## 2023-08-28 MED ORDER — IRON SUCROSE 20 MG/ML IV SOLN
200.0000 mg | Freq: Once | INTRAVENOUS | Status: AC
  Administered 2023-08-28: 200 mg via INTRAVENOUS

## 2023-08-28 MED ORDER — SODIUM CHLORIDE 0.9% FLUSH
10.0000 mL | Freq: Once | INTRAVENOUS | Status: AC | PRN
  Administered 2023-08-28: 10 mL
  Filled 2023-08-28: qty 10

## 2023-09-04 ENCOUNTER — Inpatient Hospital Stay: Payer: 59

## 2023-09-04 VITALS — BP 116/70 | HR 63 | Temp 98.6°F | Resp 18

## 2023-09-04 DIAGNOSIS — D509 Iron deficiency anemia, unspecified: Secondary | ICD-10-CM

## 2023-09-04 MED ORDER — IRON SUCROSE 20 MG/ML IV SOLN
200.0000 mg | Freq: Once | INTRAVENOUS | Status: AC
  Administered 2023-09-04: 200 mg via INTRAVENOUS
  Filled 2023-09-04: qty 10

## 2023-10-05 ENCOUNTER — Encounter: Payer: Self-pay | Admitting: Internal Medicine

## 2023-10-05 ENCOUNTER — Ambulatory Visit (INDEPENDENT_AMBULATORY_CARE_PROVIDER_SITE_OTHER): Admitting: Internal Medicine

## 2023-10-05 VITALS — BP 118/82 | HR 66 | Ht 67.5 in | Wt 231.0 lb

## 2023-10-05 DIAGNOSIS — D508 Other iron deficiency anemias: Secondary | ICD-10-CM | POA: Diagnosis not present

## 2023-10-05 DIAGNOSIS — R7303 Prediabetes: Secondary | ICD-10-CM

## 2023-10-05 DIAGNOSIS — Z0000 Encounter for general adult medical examination without abnormal findings: Secondary | ICD-10-CM

## 2023-10-05 DIAGNOSIS — E559 Vitamin D deficiency, unspecified: Secondary | ICD-10-CM | POA: Diagnosis not present

## 2023-10-05 DIAGNOSIS — E890 Postprocedural hypothyroidism: Secondary | ICD-10-CM | POA: Diagnosis not present

## 2023-10-05 DIAGNOSIS — G43109 Migraine with aura, not intractable, without status migrainosus: Secondary | ICD-10-CM | POA: Diagnosis not present

## 2023-10-05 DIAGNOSIS — Z1231 Encounter for screening mammogram for malignant neoplasm of breast: Secondary | ICD-10-CM

## 2023-10-05 DIAGNOSIS — E782 Mixed hyperlipidemia: Secondary | ICD-10-CM | POA: Insufficient documentation

## 2023-10-05 MED ORDER — VITAMIN D (ERGOCALCIFEROL) 50000 UNITS PO CAPS: 1.0000 | ORAL_CAPSULE | ORAL | 3 refills | Status: AC

## 2023-10-05 NOTE — Assessment & Plan Note (Signed)
 Supplemented

## 2023-10-05 NOTE — Assessment & Plan Note (Addendum)
 Managed with Nurtec every other day for prevention initially but now using PRN

## 2023-10-05 NOTE — Assessment & Plan Note (Signed)
 Supplemented; high dose recommended

## 2023-10-05 NOTE — Assessment & Plan Note (Addendum)
 Now seeing Hematology. Receiving IV iron - needs GYN follow up evaluation of heavy menstrual bleeding

## 2023-10-05 NOTE — Progress Notes (Signed)
 Date:  10/05/2023   Name:  Kimberly Atkinson   DOB:  1975/11/22   MRN:  [id number]   Chief Complaint: Annual Exam Kimberly Atkinson is a 48 y.o. female who presents today for her Complete Annual Exam. She feels well. She reports exercising walks, 30 minutes, 1 2 times a week. She reports she is sleeping fairly well. Breast complaints none.  Health Maintenance  Topic Date Due   HIV Screening  Never done   DTaP/Tdap/Td vaccine (1 - Tdap) Never done   Colon Cancer Screening  Never done   Flu Shot  10/19/2023*   COVID-19 Vaccine (1 - 2024-25 season) 10/21/2023*   Mammogram  12/05/2023   Hepatitis C Screening  Completed   HPV Vaccine  Aged Out  *Topic was postponed. The date shown is not the original due date.    Thyroid Problem Presents for follow-up visit. Patient reports no constipation, diarrhea, fatigue or palpitations. The symptoms have been stable.  Anemia Presents for follow-up (followed by Hematology) visit. There has been no abdominal pain, light-headedness or palpitations.  Diabetes She presents for her follow-up diabetic visit. Diabetes type: prediabetes. Pertinent negatives for hypoglycemia include no dizziness or headaches. Pertinent negatives for diabetes include no chest pain, no fatigue and no weakness.  Migraine  This is a recurrent problem. The problem occurs intermittently. The pain quality is similar to prior headaches. Pertinent negatives include no abdominal pain, coughing, dizziness or weakness. Treatments tried: doing well on Nurtec.    Review of Systems  Constitutional:  Negative for fatigue and unexpected weight change.  HENT:  Negative for trouble swallowing.   Eyes:  Negative for visual disturbance.  Respiratory:  Negative for cough, chest tightness, shortness of breath and wheezing.   Cardiovascular:  Negative for chest pain, palpitations and leg swelling.  Gastrointestinal:  Negative for abdominal pain, constipation and diarrhea.  Musculoskeletal:   Negative for arthralgias and myalgias.  Neurological:  Negative for dizziness, weakness, light-headedness and headaches.     Lab Results  Component Value Date   NA 136 10/03/2022   K 4.2 10/03/2022   CO2 22 10/03/2022   GLUCOSE 99 10/03/2022   BUN 5 (L) 10/03/2022   CREATININE 0.82 10/03/2022   CALCIUM 9.7 10/03/2022   EGFR 89 10/03/2022   GFRNONAA 92 12/05/2019   Lab Results  Component Value Date   CHOL 216 (H) 10/03/2022   HDL 64 10/03/2022   LDLCALC 135 (H) 10/03/2022   TRIG 96 10/03/2022   CHOLHDL 3.4 10/03/2022   Lab Results  Component Value Date   TSH 2.250 02/04/2023   Lab Results  Component Value Date   HGBA1C 6.0 (H) 10/03/2022   Lab Results  Component Value Date   WBC 7.8 07/24/2023   HGB 8.0 (L) 07/24/2023   HCT 27.5 (L) 07/24/2023   MCV 68.1 (L) 07/24/2023   PLT 397 07/24/2023   Lab Results  Component Value Date   ALT 20 10/03/2022   AST 25 10/03/2022   ALKPHOS 57 10/03/2022   BILITOT 0.3 10/03/2022   Lab Results  Component Value Date   VD25OH 25.2 (L) 02/04/2023     Patient Active Problem List   Diagnosis Date Noted   Mixed hyperlipidemia 10/05/2023   History of removal of ovarian cyst 08/14/2023   Migraine with aura and without status migrainosus, not intractable 05/28/2021   Pre-diabetes 12/05/2019   Vitamin D deficiency 12/05/2019   Postablative hypothyroidism 12/05/2019   Iron deficiency anemia 12/05/2019   B12  nutritional deficiency 12/05/2019   Hidradenitis axillaris 11/18/2011    Allergies  Allergen Reactions   Topamax [Topiramate] Other (See Comments)    Strange dreams    Past Surgical History:  Procedure Laterality Date   BREAST BIOPSY Left 2018   fibroadenoma   LAPAROSCOPY  08/28/2011   Procedure: LAPAROSCOPY OPERATIVE;  Surgeon: Serita Kyle, MD;  Location: [facility];  Service: Gynecology;  Laterality: Right;  Fugeration of Endometriosis, Removal of Left Paratubal Cyst./ Chromopertubation   TOTAL ABDOMINAL  HYSTERECTOMY      Social History   Tobacco Use   Smoking status: Never   Smokeless tobacco: Never  Vaping Use   Vaping status: Never Used  Substance Use Topics   Alcohol use: No   Drug use: No     Medication list has been reviewed and updated.  Current Meds  Medication Sig   cyanocobalamin 100 MCG tablet Take 100 mcg by mouth daily.   ibuprofen (ADVIL) 800 MG tablet Take 1 tablet (800 mg total) by mouth every 8 (eight) hours as needed. menstrual cramps   levothyroxine (SYNTHROID) 137 MCG tablet Take 1 tablet (137 mcg total) by mouth daily.   Multiple Vitamins-Minerals (MULTIVITAMINS THER. W/MINERALS) TABS Take 1 tablet by mouth daily.   Rimegepant Sulfate (NURTEC) 75 MG TBDP Take 1 tablet (75 mg total) by mouth every other day.   [DISCONTINUED] Vitamin D, Ergocalciferol, 50000 units CAPS Take 1 capsule by mouth once a week.       10/05/2023    2:01 PM 02/04/2023    3:12 PM 10/03/2022   10:10 AM 09/30/2021   10:12 AM  GAD 7 : Generalized Anxiety Score  Nervous, Anxious, on Edge 0 0 0 0  Control/stop worrying 0 0 0 0  Worry too much - different things 0 0 0 0  Trouble relaxing 0 0 0 0  Restless 0 0 0 0  Easily annoyed or irritable 0 0 0 0  Afraid - awful might happen 0 0 0 0  Total GAD 7 Score 0 0 0 0  Anxiety Difficulty Not difficult at all Not difficult at all Not difficult at all Not difficult at all       10/05/2023    2:01 PM 02/04/2023    3:12 PM 10/03/2022   10:09 AM  Depression screen PHQ 2/9  Decreased Interest 0 0 0  Down, Depressed, Hopeless 0 0 0  PHQ - 2 Score 0 0 0  Altered sleeping 0 0 0  Tired, decreased energy 0 0 0  Change in appetite 0 0 0  Feeling bad or failure about yourself  0 0 0  Trouble concentrating 0 0 0  Moving slowly or fidgety/restless 0 0 0  Suicidal thoughts 0 0   PHQ-9 Score 0 0 0  Difficult doing work/chores Not difficult at all Not difficult at all Not difficult at all    BP Readings from Last 3 Encounters:  10/05/23 118/82   09/04/23 116/70  08/28/23 129/84    Physical Exam Vitals and nursing note reviewed.  Constitutional:      General: She is not in acute distress.    Appearance: She is well-developed.  HENT:     Head: Normocephalic and atraumatic.     Right Ear: Tympanic membrane and ear canal normal.     Left Ear: Tympanic membrane and ear canal normal.     Nose:     Right Sinus: No maxillary sinus tenderness.     Left Sinus: No  maxillary sinus tenderness.  Eyes:     General: No scleral icterus.       Right eye: No discharge.        Left eye: No discharge.     Conjunctiva/sclera: Conjunctivae normal.  Neck:     Thyroid: No thyromegaly.     Vascular: No carotid bruit.  Cardiovascular:     Rate and Rhythm: Normal rate and regular rhythm.     Pulses: Normal pulses.     Heart sounds: Normal heart sounds.  Pulmonary:     Effort: Pulmonary effort is normal. No respiratory distress.     Breath sounds: No wheezing.  Abdominal:     General: Bowel sounds are normal.     Palpations: Abdomen is soft.     Tenderness: There is no abdominal tenderness.  Musculoskeletal:     Cervical back: Normal range of motion. No erythema.     Right lower leg: No edema.     Left lower leg: No edema.  Lymphadenopathy:     Cervical: No cervical adenopathy.  Skin:    General: Skin is warm and dry.     Findings: No rash.  Neurological:     Mental Status: She is alert and oriented to person, place, and time.     Cranial Nerves: No cranial nerve deficit.     Sensory: No sensory deficit.     Deep Tendon Reflexes: Reflexes are normal and symmetric.  Psychiatric:        Attention and Perception: Attention normal.        Mood and Affect: Mood normal.     Wt Readings from Last 3 Encounters:  10/05/23 231 lb (104.8 kg)  07/24/23 229 lb (103.9 kg)  02/04/23 231 lb (104.8 kg)    BP 118/82   Pulse 66   Ht 5' 7.5" (1.715 m)   Wt 231 lb (104.8 kg)   LMP 09/20/2023 (Approximate)   SpO2 98%   BMI 35.65 kg/m    Assessment and Plan:  Problem List Items Addressed This Visit       Unprioritized   Pre-diabetes   Managed with diet changes. Lab Results  Component Value Date   HGBA1C 6.0 (H) 10/03/2022         Relevant Orders   Hemoglobin A1c   Vitamin D deficiency   Supplemented; high dose recommended      Relevant Medications   Vitamin D, Ergocalciferol, 50000 units CAPS   Other Relevant Orders   VITAMIN D 25 Hydroxy (Vit-D Deficiency, Fractures)   Postablative hypothyroidism   Supplemented       Relevant Orders   TSH+T4F+T3Free   Iron deficiency anemia   Now seeing Hematology. Receiving IV iron - needs GYN follow up evaluation of heavy menstrual bleeding      Migraine with aura and without status migrainosus, not intractable   Managed with Nurtec every other day for prevention initially but now using PRN      Relevant Orders   Comprehensive metabolic panel   Mixed hyperlipidemia   Relevant Orders   Lipid panel   Other Visit Diagnoses       Annual physical exam    -  Primary   continue exercise and healthy diet Mammogram in May declines CRC screen at this time   Relevant Orders   Comprehensive metabolic panel   Hemoglobin A1c   Lipid panel   TSH+T4F+T3Free   VITAMIN D 25 Hydroxy (Vit-D Deficiency, Fractures)     Encounter for screening mammogram  for breast cancer       done yearly at [facility]       Return in about 6 months (around 04/06/2024) for thyroid.    Reubin Milan, MD [facility] [place]

## 2023-10-05 NOTE — Assessment & Plan Note (Signed)
 Managed with diet changes. Lab Results  Component Value Date   HGBA1C 6.0 (H) 10/03/2022

## 2023-10-06 LAB — COMPREHENSIVE METABOLIC PANEL
ALT: 20 IU/L (ref 0–32)
AST: 24 IU/L (ref 0–40)
Albumin: 4.5 g/dL (ref 3.9–4.9)
Alkaline Phosphatase: 52 IU/L (ref 44–121)
BUN/Creatinine Ratio: 7 — ABNORMAL LOW (ref 9–23)
BUN: 6 mg/dL (ref 6–24)
Bilirubin Total: 0.3 mg/dL (ref 0.0–1.2)
CO2: 23 mmol/L (ref 20–29)
Calcium: 10.6 mg/dL — ABNORMAL HIGH (ref 8.7–10.2)
Chloride: 104 mmol/L (ref 96–106)
Creatinine, Ser: 0.82 mg/dL (ref 0.57–1.00)
Globulin, Total: 2.5 g/dL (ref 1.5–4.5)
Glucose: 94 mg/dL (ref 70–99)
Potassium: 4.3 mmol/L (ref 3.5–5.2)
Sodium: 137 mmol/L (ref 134–144)
Total Protein: 7 g/dL (ref 6.0–8.5)
eGFR: 89 mL/min/{1.73_m2} (ref >59)

## 2023-10-06 LAB — HEMOGLOBIN A1C
Est. average glucose Bld gHb Est-mCnc: 105 mg/dL
Hgb A1c MFr Bld: 5.3 % (ref 4.8–5.6)

## 2023-10-06 LAB — LIPID PANEL
Chol/HDL Ratio: 3.3 ratio (ref 0.0–4.4)
Cholesterol, Total: 245 mg/dL — ABNORMAL HIGH (ref 100–199)
HDL: 74 mg/dL (ref >39)
LDL Chol Calc (NIH): 141 mg/dL — ABNORMAL HIGH (ref 0–99)
Triglycerides: 170 mg/dL — ABNORMAL HIGH (ref 0–149)
VLDL Cholesterol Cal: 30 mg/dL (ref 5–40)

## 2023-10-06 LAB — TSH+T4F+T3FREE
Free T4: 0.39 ng/dL — ABNORMAL LOW (ref 0.82–1.77)
T3, Free: 1.3 pg/mL — ABNORMAL LOW (ref 2.0–4.4)
TSH: 71.8 u[IU]/mL — ABNORMAL HIGH (ref 0.450–4.500)

## 2023-10-06 LAB — VITAMIN D 25 HYDROXY (VIT D DEFICIENCY, FRACTURES): Vit D, 25-Hydroxy: 32.1 ng/mL (ref 30.0–100.0)

## 2023-10-08 ENCOUNTER — Encounter: Payer: Self-pay | Admitting: Internal Medicine

## 2023-10-20 ENCOUNTER — Other Ambulatory Visit: Payer: Self-pay | Admitting: Internal Medicine

## 2023-10-20 DIAGNOSIS — D509 Iron deficiency anemia, unspecified: Secondary | ICD-10-CM

## 2023-10-20 DIAGNOSIS — G43109 Migraine with aura, not intractable, without status migrainosus: Secondary | ICD-10-CM

## 2023-10-22 NOTE — Telephone Encounter (Signed)
 Refused Folivane Plus 125 mg - iron 1 mg capsule discontinued 10/05/2023.

## 2023-11-16 ENCOUNTER — Other Ambulatory Visit: Payer: Self-pay | Admitting: *Deleted

## 2023-11-16 DIAGNOSIS — D509 Iron deficiency anemia, unspecified: Secondary | ICD-10-CM

## 2023-11-20 ENCOUNTER — Ambulatory Visit: Payer: 59

## 2023-11-20 ENCOUNTER — Encounter: Payer: Self-pay | Admitting: Internal Medicine

## 2023-11-20 ENCOUNTER — Inpatient Hospital Stay: Payer: 59 | Attending: Internal Medicine

## 2023-11-20 ENCOUNTER — Inpatient Hospital Stay (HOSPITAL_BASED_OUTPATIENT_CLINIC_OR_DEPARTMENT_OTHER): Payer: 59 | Admitting: Internal Medicine

## 2023-11-20 VITALS — BP 140/81 | HR 76 | Temp 97.8°F | Resp 20 | Wt 226.0 lb

## 2023-11-20 DIAGNOSIS — Z79899 Other long term (current) drug therapy: Secondary | ICD-10-CM | POA: Insufficient documentation

## 2023-11-20 DIAGNOSIS — N920 Excessive and frequent menstruation with regular cycle: Secondary | ICD-10-CM | POA: Insufficient documentation

## 2023-11-20 DIAGNOSIS — D509 Iron deficiency anemia, unspecified: Secondary | ICD-10-CM | POA: Diagnosis present

## 2023-11-20 LAB — CBC WITH DIFFERENTIAL (CANCER CENTER ONLY)
Abs Immature Granulocytes: 0.04 10*3/uL (ref 0.00–0.07)
Basophils Absolute: 0.1 10*3/uL (ref 0.0–0.1)
Basophils Relative: 1 %
Eosinophils Absolute: 0.3 10*3/uL (ref 0.0–0.5)
Eosinophils Relative: 4 %
HCT: 28.8 % — ABNORMAL LOW (ref 36.0–46.0)
Hemoglobin: 8.8 g/dL — ABNORMAL LOW (ref 12.0–15.0)
Immature Granulocytes: 1 %
Lymphocytes Relative: 41 %
Lymphs Abs: 2.7 10*3/uL (ref 0.7–4.0)
MCH: 24.9 pg — ABNORMAL LOW (ref 26.0–34.0)
MCHC: 30.6 g/dL (ref 30.0–36.0)
MCV: 81.4 fL (ref 80.0–100.0)
Monocytes Absolute: 0.5 10*3/uL (ref 0.1–1.0)
Monocytes Relative: 7 %
Neutro Abs: 3.1 10*3/uL (ref 1.7–7.7)
Neutrophils Relative %: 46 %
Platelet Count: 298 10*3/uL (ref 150–400)
RBC: 3.54 MIL/uL — ABNORMAL LOW (ref 3.87–5.11)
RDW: 15.4 % (ref 11.5–15.5)
WBC Count: 6.7 10*3/uL (ref 4.0–10.5)
nRBC: 0 % (ref 0.0–0.2)

## 2023-11-20 LAB — VITAMIN B12: Vitamin B-12: 406 pg/mL (ref 180–914)

## 2023-11-20 LAB — IRON AND TIBC
Iron: 37 ug/dL (ref 28–170)
Saturation Ratios: 9 % — ABNORMAL LOW (ref 10.4–31.8)
TIBC: 396 ug/dL (ref 250–450)
UIBC: 359 ug/dL

## 2023-11-20 LAB — FERRITIN: Ferritin: 14 ng/mL (ref 11–307)

## 2023-11-20 MED ORDER — FOLIVANE-PLUS PO CAPS: 1.0000 | ORAL_CAPSULE | ORAL | 3 refills | Status: AC

## 2023-11-20 NOTE — Progress Notes (Signed)
 Patient is doing ok, she said that the iron  infusions helped her feel better and her BP went back to being normal. She really doesn't have any new questions or concerns for the doctor today. She is taking a iron  supplement once daily for 5 days a week. She is also taking a vitamin D  pill as well.

## 2023-11-20 NOTE — Progress Notes (Signed)
 [facility]  [phone] [phone]  ID: Kimberly Atkinson OB: [birth year]  MR#: [id number]  AOZ#:[id number]  Patient Care Team: Sheron Dixons, MD as PCP - General (Internal Medicine) Ivery Marking, MD as Consulting Physician (Obstetrics and Gynecology) Loreatha Rodney, MD as Consulting Physician (Oncology)  REFERRING PROVIDER: Dr. Janna Melter  REASON FOR REFERRAL: Iron  deficiency anemia  HPI: Kimberly Atkinson is a 48 y.o. female with past medical history of iron  deficiency anemia, prediabetes, anxiety referred to hematology for IDA.  Patient has longstanding history of iron  deficiency anemia.  She reports heavy menstrual cycles on the first 3 days where she has to use extra large pad and changes 6-7 times a day.  Ongoing for past 3 to 4 years.  She last seen OB/GYN in 2022.  Has history of fibroids.  Reports in June 2024 she was shopping and blacked out and fainted.  She was previously on oral iron .  Was advised to stop in July 2024 by PCP based on her blood work.  No history of gastric bypass surgery.  Occasional use of ibuprofen  with menstrual cycle.  No colonoscopy or endoscopy in the past.  Energy level is fair.  Completed IV Venofer  in February 2025.  Interval history-  Patient was seen today as follow-up for iron  deficiency anemia, labs. Feeling significantly better with iron  infusions Recently few days feeling tired. Started exercising  continues to have heavy menstrual cycles. denies any bleeding in urine or stools.  REVIEW OF SYSTEMS:   ROS  As per HPI. Otherwise, a complete review of systems is negative.  PAST MEDICAL HISTORY: Past Medical History:  Diagnosis Date   Anemia    Takes iron  and folic acid   Anxiety    Diabetes mellitus without complication (HCC)    Pre- diabetic   GERD (gastroesophageal reflux disease)    no meds at present   Headache    Hypothyroidism    Insomnia    Piriformis syndrome     Situational depression    Vitamin B deficiency    Vitamin D  deficiency     PAST SURGICAL HISTORY: Past Surgical History:  Procedure Laterality Date   BREAST BIOPSY Left 2018   fibroadenoma   LAPAROSCOPY  08/28/2011   Procedure: LAPAROSCOPY OPERATIVE;  Surgeon: Kandra Orn, MD;  Location: [facility];  Service: Gynecology;  Laterality: Right;  Fugeration of Endometriosis, Removal of Left Paratubal Cyst./ Chromopertubation   TOTAL ABDOMINAL HYSTERECTOMY      FAMILY HISTORY: Family History  Problem Relation Age of Onset   Diabetes Mother    Hypertension Mother    Hyperlipidemia Mother     HEALTH MAINTENANCE: Social History   Tobacco Use   Smoking status: Never   Smokeless tobacco: Never  Vaping Use   Vaping status: Never Used  Substance Use Topics   Alcohol use: No   Drug use: No     Allergies  Allergen Reactions   Topamax [Topiramate] Other (See Comments)    Strange dreams    Current Outpatient Medications  Medication Sig Dispense Refill   cyanocobalamin 100 MCG tablet Take 100 mcg by mouth daily.     FeFum-FePoly-FA-B Cmp-C-Biot (FOLIVANE-PLUS) CAPS Take 1 each by mouth every Monday, Wednesday, and Friday. 30 capsule 3   IBU 800 MG tablet Take 1 tablet (800 mg total) by mouth every 8 (eight) hours as needed for menstrual cramps 100 tablet 0   levothyroxine  (SYNTHROID ) 137 MCG tablet Take 1 tablet (137 mcg total) by  mouth daily. 90 tablet 3   Multiple Vitamins-Minerals (MULTIVITAMINS THER. W/MINERALS) TABS Take 1 tablet by mouth daily. Take one every day for 5 days a week.     Rimegepant Sulfate (NURTEC) 75 MG TBDP Take 1 tablet (75 mg total) by mouth every other day. 16 tablet 5   Vitamin D , Ergocalciferol , 50000 units CAPS Take 1 capsule by mouth once a week. 12 capsule 3   No current facility-administered medications for this visit.    OBJECTIVE: Vitals:   11/20/23 1429 11/20/23 1435  BP: (!) 142/81 (!) 140/81  Pulse: 76   Resp: 20   Temp: 97.8 F  (36.6 C)   SpO2: 100%       Body mass index is 34.87 kg/m.      General: Well-developed, well-nourished, no acute distress. Eyes: Pink conjunctiva, anicteric sclera. HEENT: Normocephalic, moist mucous membranes, clear oropharnyx. Lungs: Clear to auscultation bilaterally. Heart: Regular rate and rhythm. No rubs, murmurs, or gallops. Abdomen: Soft, nontender, nondistended. No organomegaly noted, normoactive bowel sounds. Musculoskeletal: No edema, cyanosis, or clubbing. Neuro: Alert, answering all questions appropriately. Cranial nerves grossly intact. Skin: No rashes or petechiae noted. Psych: Normal affect. Lymphatics: No cervical, calvicular, axillary or inguinal LAD.   LAB RESULTS:  Lab Results  Component Value Date   NA 137 10/05/2023   K 4.3 10/05/2023   CL 104 10/05/2023   CO2 23 10/05/2023   GLUCOSE 94 10/05/2023   BUN 6 10/05/2023   CREATININE 0.82 10/05/2023   CALCIUM 10.6 (H) 10/05/2023   PROT 7.0 10/05/2023   ALBUMIN 4.5 10/05/2023   AST 24 10/05/2023   ALT 20 10/05/2023   ALKPHOS 52 10/05/2023   BILITOT 0.3 10/05/2023   GFRNONAA 92 12/05/2019   GFRAA 106 12/05/2019    Lab Results  Component Value Date   WBC 6.7 11/20/2023   NEUTROABS 3.1 11/20/2023   HGB 8.8 (L) 11/20/2023   HCT 28.8 (L) 11/20/2023   MCV 81.4 11/20/2023   PLT 298 11/20/2023    Lab Results  Component Value Date   TIBC 504 (H) 07/24/2023   TIBC <379 02/04/2023   TIBC 393 12/23/2022   FERRITIN 4 (L) 07/24/2023   FERRITIN 19 02/04/2023   FERRITIN 38 12/23/2022   IRONPCTSAT 6 (L) 07/24/2023   IRONPCTSAT >96 (HH) 02/04/2023   IRONPCTSAT 8 (LL) 12/23/2022     STUDIES: No results found.  ASSESSMENT AND PLAN:   Kimberly Atkinson is a 48 y.o. female with pmh of  iron  deficiency anemia, prediabetes, anxiety referred to hematology for IDA.  # Iron  deficiency anemia -Likely secondary to heavy menstrual cycles.  Cannot rule out occult GI bleed. -Completed IV Venofer  weekly x 5  doses in February 2025.  Her hemoglobin continues to be low at 8.8.  Iron  studies are pending.  She was scheduled for iron  infusions however prior to the appointment she called and canceled it.  Will schedule for IV Venofer  weekly x 5 again. - Previously, GI referral was placed however patient did not schedule.  Now with no improvement in hemoglobin, she is open to seeing GI specifically requesting for Dr. Legrand Puma.  Will make a referral. - Will also make referral to OB/GYN at [facility] clinic for further evaluation of heavy menstrual cycles with history of fibroid.   Orders Placed This Encounter  Procedures   CBC with Differential (Cancer Center Only)   Vitamin B12   Iron  and TIBC   Ferritin   Ambulatory referral to Gastroenterology   Ambulatory  referral to Obstetrics / Gynecology   Follow-up in 3 months for MD visit, labs, possible Venofer   Patient expressed understanding and was in agreement with this plan. She also understands that She can call clinic at any time with any questions, concerns, or complaints.   I spent a total of 25 minutes reviewing chart data, face-to-face evaluation with the patient, counseling and coordination of care as detailed above.  Loreatha Rodney, MD   11/20/2023 3:04 PM

## 2023-11-23 ENCOUNTER — Telehealth: Payer: Self-pay | Admitting: *Deleted

## 2023-11-23 NOTE — Telephone Encounter (Signed)
 Kimberly Atkinson (Kimberly Atkinson) - [id number] Folivane-Plus capsules status: PA RequestCreated: May 2nd, 2025 [id number] Sent: May 5th, 2025

## 2023-11-23 NOTE — Telephone Encounter (Signed)
-----   Message from Charity Conch sent at 11/23/2023  8:05 AM EDT ----- Regarding: PRIOR AUTH NEEDED- [facility] PRIOR AUTH NEEDED- [facility] sent to her chart.

## 2023-11-27 ENCOUNTER — Inpatient Hospital Stay

## 2023-11-27 VITALS — BP 124/74 | HR 74 | Temp 97.2°F

## 2023-11-27 DIAGNOSIS — D509 Iron deficiency anemia, unspecified: Secondary | ICD-10-CM | POA: Diagnosis not present

## 2023-11-27 MED ORDER — IRON SUCROSE 20 MG/ML IV SOLN
200.0000 mg | Freq: Once | INTRAVENOUS | Status: AC
  Administered 2023-11-27: 200 mg via INTRAVENOUS

## 2023-11-27 MED ORDER — SODIUM CHLORIDE 0.9% FLUSH
10.0000 mL | Freq: Once | INTRAVENOUS | Status: AC | PRN
  Administered 2023-11-27: 10 mL
  Filled 2023-11-27: qty 10

## 2023-12-04 ENCOUNTER — Inpatient Hospital Stay

## 2023-12-04 VITALS — BP 137/87 | HR 62 | Temp 97.8°F

## 2023-12-04 DIAGNOSIS — D509 Iron deficiency anemia, unspecified: Secondary | ICD-10-CM | POA: Diagnosis not present

## 2023-12-04 MED ORDER — SODIUM CHLORIDE 0.9% FLUSH
10.0000 mL | Freq: Once | INTRAVENOUS | Status: AC | PRN
  Administered 2023-12-04: 10 mL
  Filled 2023-12-04: qty 10

## 2023-12-04 MED ORDER — IRON SUCROSE 20 MG/ML IV SOLN
200.0000 mg | Freq: Once | INTRAVENOUS | Status: AC
  Administered 2023-12-04: 200 mg via INTRAVENOUS
  Filled 2023-12-04: qty 10

## 2023-12-04 NOTE — Patient Instructions (Signed)

## 2023-12-04 NOTE — Progress Notes (Signed)
Patient tolerated Venofer infusion well. Explained recommendation of 30 min post monitoring. Patient refused to wait post monitoring. Educated on what signs to watch for & to call with any concerns. No question, discharged. Stable  

## 2023-12-11 ENCOUNTER — Inpatient Hospital Stay

## 2023-12-18 ENCOUNTER — Inpatient Hospital Stay

## 2023-12-18 ENCOUNTER — Other Ambulatory Visit: Payer: Self-pay | Admitting: Oncology

## 2023-12-18 VITALS — BP 127/82 | HR 67 | Temp 97.2°F | Resp 18

## 2023-12-18 DIAGNOSIS — D509 Iron deficiency anemia, unspecified: Secondary | ICD-10-CM

## 2023-12-18 MED ORDER — IRON SUCROSE 20 MG/ML IV SOLN
200.0000 mg | Freq: Once | INTRAVENOUS | Status: AC
  Administered 2023-12-18: 200 mg via INTRAVENOUS

## 2023-12-25 ENCOUNTER — Inpatient Hospital Stay

## 2024-01-01 ENCOUNTER — Inpatient Hospital Stay: Attending: Internal Medicine

## 2024-01-01 VITALS — BP 119/78 | HR 65 | Temp 97.4°F | Resp 18

## 2024-01-01 DIAGNOSIS — D509 Iron deficiency anemia, unspecified: Secondary | ICD-10-CM | POA: Insufficient documentation

## 2024-01-01 DIAGNOSIS — N920 Excessive and frequent menstruation with regular cycle: Secondary | ICD-10-CM | POA: Insufficient documentation

## 2024-01-01 MED ORDER — IRON SUCROSE 20 MG/ML IV SOLN
200.0000 mg | Freq: Once | INTRAVENOUS | Status: AC
  Administered 2024-01-01: 200 mg via INTRAVENOUS

## 2024-01-08 ENCOUNTER — Inpatient Hospital Stay

## 2024-01-08 VITALS — BP 141/98 | HR 66 | Temp 97.5°F | Resp 17

## 2024-01-08 DIAGNOSIS — D509 Iron deficiency anemia, unspecified: Secondary | ICD-10-CM

## 2024-01-08 MED ORDER — SODIUM CHLORIDE 0.9% FLUSH
10.0000 mL | Freq: Once | INTRAVENOUS | Status: AC
  Administered 2024-01-08: 10 mL via INTRAVENOUS
  Filled 2024-01-08: qty 10

## 2024-01-08 MED ORDER — IRON SUCROSE 20 MG/ML IV SOLN
200.0000 mg | Freq: Once | INTRAVENOUS | Status: AC
  Administered 2024-01-08: 200 mg via INTRAVENOUS
  Filled 2024-01-08: qty 10

## 2024-01-08 NOTE — Patient Instructions (Signed)

## 2024-01-08 NOTE — Progress Notes (Signed)
Patient tolerated Venofer infusion well. Explained recommendation of 30 min post monitoring. Patient refused to wait post monitoring. Educated on what signs to watch for & to call with any concerns. No questions, discharged. Stable  

## 2024-02-29 ENCOUNTER — Inpatient Hospital Stay: Attending: Internal Medicine

## 2024-02-29 ENCOUNTER — Encounter: Payer: Self-pay | Admitting: Oncology

## 2024-02-29 ENCOUNTER — Other Ambulatory Visit: Payer: Self-pay | Admitting: Oncology

## 2024-02-29 ENCOUNTER — Encounter: Payer: Self-pay | Admitting: Internal Medicine

## 2024-02-29 ENCOUNTER — Inpatient Hospital Stay

## 2024-02-29 ENCOUNTER — Inpatient Hospital Stay (HOSPITAL_BASED_OUTPATIENT_CLINIC_OR_DEPARTMENT_OTHER): Admitting: Oncology

## 2024-02-29 VITALS — BP 117/80 | HR 77 | Temp 97.0°F | Resp 18 | Ht 67.5 in | Wt 218.1 lb

## 2024-02-29 DIAGNOSIS — D509 Iron deficiency anemia, unspecified: Secondary | ICD-10-CM | POA: Insufficient documentation

## 2024-02-29 DIAGNOSIS — N920 Excessive and frequent menstruation with regular cycle: Secondary | ICD-10-CM | POA: Diagnosis present

## 2024-02-29 LAB — CBC WITH DIFFERENTIAL (CANCER CENTER ONLY)
Abs Immature Granulocytes: 0.03 K/uL (ref 0.00–0.07)
Basophils Absolute: 0.1 K/uL (ref 0.0–0.1)
Basophils Relative: 1 %
Eosinophils Absolute: 0.2 K/uL (ref 0.0–0.5)
Eosinophils Relative: 3 %
HCT: 32.1 % — ABNORMAL LOW (ref 36.0–46.0)
Hemoglobin: 10.1 g/dL — ABNORMAL LOW (ref 12.0–15.0)
Immature Granulocytes: 0 %
Lymphocytes Relative: 40 %
Lymphs Abs: 2.7 K/uL (ref 0.7–4.0)
MCH: 25.5 pg — ABNORMAL LOW (ref 26.0–34.0)
MCHC: 31.5 g/dL (ref 30.0–36.0)
MCV: 81.1 fL (ref 80.0–100.0)
Monocytes Absolute: 0.3 K/uL (ref 0.1–1.0)
Monocytes Relative: 5 %
Neutro Abs: 3.5 K/uL (ref 1.7–7.7)
Neutrophils Relative %: 51 %
Platelet Count: 365 K/uL (ref 150–400)
RBC: 3.96 MIL/uL (ref 3.87–5.11)
RDW: 14.9 % (ref 11.5–15.5)
WBC Count: 6.8 K/uL (ref 4.0–10.5)
nRBC: 0 % (ref 0.0–0.2)

## 2024-02-29 LAB — FERRITIN: Ferritin: 30 ng/mL (ref 11–307)

## 2024-02-29 LAB — IRON AND TIBC
Iron: 54 ug/dL (ref 28–170)
Saturation Ratios: 14 % (ref 10.4–31.8)
TIBC: 398 ug/dL (ref 250–450)
UIBC: 344 ug/dL

## 2024-02-29 NOTE — Progress Notes (Signed)
 Patient previously referred to GI under Dr. Adelfa care; spoke to [facility] GI [phone].  Davita will contact patient now to schedule.

## 2024-02-29 NOTE — Progress Notes (Signed)
 Hematology/Oncology Consult note [facility]  Telephone:([phone] [phone]  Patient Care Team: Justus Leita DEL, MD as PCP - General (Internal Medicine) Rutherford Gain, MD as Consulting Physician (Obstetrics and Gynecology) Melanee Annah BROCKS, MD as Consulting Physician (Oncology)   Name of the patient: Kimberly Atkinson  [id number]  1975/12/11   Date of visit: 02/29/24  Diagnosis-iron  deficiency anemia  Chief complaint/ Reason for visit-routine follow-up of iron  deficiency anemia  Heme/Onc history: Patient is a 48 year old African-American female with history of iron  deficiency anemia which has been attributed to heavy menstrual cycles.  She has received IV iron  in the past and is transferring her care from Dr. Brutus to me.  She has not been seen by GI yet.  She denies any blood loss in her stool or urine.  Interval history-patient presently reports that her menstrual cycles last for about 7 days out of which 3 days are particularly heavy.  She has not seen GYN recently.  Energy levels have remained stable.  ECOG PS- 0 Pain scale- 0   Review of systems- Review of Systems  Constitutional:  Negative for chills, fever, malaise/fatigue and weight loss.  HENT:  Negative for congestion, ear discharge and nosebleeds.   Eyes:  Negative for blurred vision.  Respiratory:  Negative for cough, hemoptysis, sputum production, shortness of breath and wheezing.   Cardiovascular:  Negative for chest pain, palpitations, orthopnea and claudication.  Gastrointestinal:  Negative for abdominal pain, blood in stool, constipation, diarrhea, heartburn, melena, nausea and vomiting.  Genitourinary:  Negative for dysuria, flank pain, frequency, hematuria and urgency.  Musculoskeletal:  Negative for back pain, joint pain and myalgias.  Skin:  Negative for rash.  Neurological:  Negative for dizziness, tingling, focal weakness, seizures, weakness and headaches.   Endo/Heme/Allergies:  Does not bruise/bleed easily.  Psychiatric/Behavioral:  Negative for depression and suicidal ideas. The patient does not have insomnia.       Allergies  Allergen Reactions   Topamax [Topiramate] Other (See Comments)    Strange dreams     Past Medical History:  Diagnosis Date   Anemia    Takes iron  and folic acid   Anxiety    Diabetes mellitus without complication (HCC)    Pre- diabetic   GERD (gastroesophageal reflux disease)    no meds at present   Headache    Hypothyroidism    Insomnia    Piriformis syndrome    Situational depression    Vitamin B deficiency    Vitamin D  deficiency      Past Surgical History:  Procedure Laterality Date   BREAST BIOPSY Left 2018   fibroadenoma   LAPAROSCOPY  08/28/2011   Procedure: LAPAROSCOPY OPERATIVE;  Surgeon: Gain DELENA Rutherford, MD;  Location: [facility];  Service: Gynecology;  Laterality: Right;  Fugeration of Endometriosis, Removal of Left Paratubal Cyst./ Chromopertubation   TOTAL ABDOMINAL HYSTERECTOMY      Social History   Socioeconomic History   Marital status: Single    Spouse name: Not on file   Number of children: 0   Years of education: Not on file   Highest education level: Not on file  Occupational History   Not on file  Tobacco Use   Smoking status: Never   Smokeless tobacco: Never  Vaping Use   Vaping status: Never Used  Substance and Sexual Activity   Alcohol use: No   Drug use: No   Sexual activity: Not Currently    Birth control/protection: None  Other  Topics Concern   Not on file  Social History Narrative   Not on file   Social Drivers of Health   Financial Resource Strain: Low Risk  (10/03/2022)   Overall Financial Resource Strain (CARDIA)    Difficulty of Paying Living Expenses: Not hard at all  Food Insecurity: No Food Insecurity (10/05/2023)   Hunger Vital Sign    Worried About Running Out of Food in the Last Year: Never true    Ran Out of Food in the Last Year:  Never true  Transportation Needs: No Transportation Needs (10/05/2023)   PRAPARE - Administrator, Civil Service (Medical): No    Lack of Transportation (Non-Medical): No  Physical Activity: Not on file  Stress: Not on file  Social Connections: Not on file  Intimate Partner Violence: Not At Risk (10/05/2023)   Humiliation, Afraid, Rape, and Kick questionnaire    Fear of Current or Ex-Partner: No    Emotionally Abused: No    Physically Abused: No    Sexually Abused: No    Family History  Problem Relation Age of Onset   Diabetes Mother    Hypertension Mother    Hyperlipidemia Mother      Current Outpatient Medications:    cyanocobalamin  100 MCG tablet, Take 100 mcg by mouth daily., Disp: , Rfl:    FeFum-FePoly-FA-B Cmp-C-Biot (FOLIVANE-PLUS) CAPS, Take by mouth., Disp: , Rfl:    IBU 800 MG tablet, Take 1 tablet (800 mg total) by mouth every 8 (eight) hours as needed for menstrual cramps, Disp: 100 tablet, Rfl: 0   levothyroxine  (SYNTHROID ) 137 MCG tablet, Take 1 tablet (137 mcg total) by mouth daily., Disp: 90 tablet, Rfl: 3   Multiple Vitamins-Minerals (MULTIVITAMINS THER. W/MINERALS) TABS, Take 1 tablet by mouth daily. Take one every day for 5 days a week., Disp: , Rfl:    Rimegepant Sulfate (NURTEC) 75 MG TBDP, Take 1 tablet (75 mg total) by mouth every other day., Disp: 16 tablet, Rfl: 5   Vitamin D , Ergocalciferol , 50000 units CAPS, Take 1 capsule by mouth once a week., Disp: 12 capsule, Rfl: 3  Physical exam:  Vitals:   02/29/24 1318  BP: 117/80  Pulse: 77  Resp: 18  Temp: (!) 97 F (36.1 C)  TempSrc: Tympanic  SpO2: 100%  Weight: 218 lb 1.6 oz (98.9 kg)  Height: 5' 7.5 (1.715 m)   Physical Exam Cardiovascular:     Rate and Rhythm: Normal rate and regular rhythm.     Heart sounds: Normal heart sounds.  Pulmonary:     Effort: Pulmonary effort is normal.     Breath sounds: Normal breath sounds.  Skin:    General: Skin is warm and dry.  Neurological:      Mental Status: She is alert and oriented to person, place, and time.      I have personally reviewed labs listed below:    Latest Ref Rng & Units 10/05/2023    2:38 PM  CMP  Glucose 70 - 99 mg/dL 94   BUN 6 - 24 mg/dL 6   Creatinine 9.42 - 8.99 mg/dL 9.17   Sodium 865 - 855 mmol/L 137   Potassium 3.5 - 5.2 mmol/L 4.3   Chloride 96 - 106 mmol/L 104   CO2 20 - 29 mmol/L 23   Calcium 8.7 - 10.2 mg/dL 89.3   Total Protein 6.0 - 8.5 g/dL 7.0   Total Bilirubin 0.0 - 1.2 mg/dL 0.3   Alkaline Phos 44 - 121  IU/L 52   AST 0 - 40 IU/L 24   ALT 0 - 32 IU/L 20       Latest Ref Rng & Units 02/29/2024    1:03 PM  CBC  WBC 4.0 - 10.5 K/uL 6.8   Hemoglobin 12.0 - 15.0 g/dL 89.8   Hematocrit 63.9 - 46.0 % 32.1   Platelets 150 - 400 K/uL 365      Assessment and plan- Patient is a 48 y.o. female here for routine follow-up of iron  deficiency anemia  Patient's hemoglobin is improved from 8-10 but is not quite back to her baseline which was close to 12.  Iron  studies were pending at the time of my visit with the patient but did come back with a ferritin level of 30.  Ideally I would like her ferritin levels to be closer to 50.  Iron  saturation is also improved but remains less than 20%.  She would benefit from IV iron .  We will reach out to the patient now that iron  levels are back.  Discussed risks and benefits of IV iron  including all but not limited to possible risk of infusion reaction.  Patient will let us  know if she wants to proceed or not.  We are also referring her to GI Dr. Abran as per her request.  Have also encouraged her to establish follow-up with GYN   Visit Diagnosis 1. Iron  deficiency anemia, unspecified iron  deficiency anemia type      Dr. Annah Skene, MD, MPH [facility] [phone] 02/29/2024 4:30 PM

## 2024-02-29 NOTE — Addendum Note (Signed)
 Addended by: MELANEE ANNAH BROCKS on: 02/29/2024 04:36 PM   Modules accepted: Level of Service

## 2024-03-01 ENCOUNTER — Telehealth: Payer: Self-pay

## 2024-03-01 NOTE — Telephone Encounter (Signed)
 Per Dr. Melanee Ferritin levels are still less than 50 and she would likely benefit from more IV iron  if she is agreeable .  Patient is already scheduled for IV venofer .  Outbound call; detailed voice message left.

## 2024-03-03 NOTE — Telephone Encounter (Signed)
 Outbound call to patient; reviewed recent labs and upcoming future appointments.  No further questions / concerns at this time.

## 2024-03-03 NOTE — Telephone Encounter (Signed)
 Received voicemail from patient 03/02/24 @ 11:29AM stating she had some questions.  Outbound call;

## 2024-03-07 ENCOUNTER — Inpatient Hospital Stay

## 2024-03-14 ENCOUNTER — Inpatient Hospital Stay

## 2024-03-18 ENCOUNTER — Inpatient Hospital Stay

## 2024-03-18 VITALS — BP 111/69 | HR 57 | Temp 98.8°F | Resp 17

## 2024-03-18 DIAGNOSIS — D509 Iron deficiency anemia, unspecified: Secondary | ICD-10-CM

## 2024-03-18 MED ORDER — IRON SUCROSE 20 MG/ML IV SOLN
200.0000 mg | INTRAVENOUS | Status: DC
  Administered 2024-03-18: 200 mg via INTRAVENOUS
  Filled 2024-03-18: qty 10

## 2024-03-18 MED ORDER — SODIUM CHLORIDE 0.9% FLUSH
10.0000 mL | Freq: Once | INTRAVENOUS | Status: AC | PRN
  Administered 2024-03-18: 10 mL
  Filled 2024-03-18: qty 10

## 2024-03-18 NOTE — Progress Notes (Signed)
 Patient declined to wait the 30 minutes for post iron infusion observation today. Tolerated infusion well. VSS.

## 2024-03-23 ENCOUNTER — Inpatient Hospital Stay

## 2024-03-25 ENCOUNTER — Inpatient Hospital Stay

## 2024-03-30 ENCOUNTER — Inpatient Hospital Stay

## 2024-04-01 ENCOUNTER — Inpatient Hospital Stay: Attending: Internal Medicine

## 2024-04-08 ENCOUNTER — Inpatient Hospital Stay

## 2024-04-15 ENCOUNTER — Inpatient Hospital Stay

## 2024-04-22 ENCOUNTER — Ambulatory Visit: Admitting: Internal Medicine

## 2024-04-22 ENCOUNTER — Inpatient Hospital Stay

## 2024-04-22 ENCOUNTER — Encounter: Payer: Self-pay | Admitting: Internal Medicine

## 2024-04-22 VITALS — BP 122/80 | HR 82 | Ht 67.5 in | Wt 219.0 lb

## 2024-04-22 DIAGNOSIS — D509 Iron deficiency anemia, unspecified: Secondary | ICD-10-CM | POA: Diagnosis not present

## 2024-04-22 MED ORDER — NA SULFATE-K SULFATE-MG SULF 17.5-3.13-1.6 GM/177ML PO SOLN: 1.0000 | Freq: Once | ORAL | 0 refills | Status: AC

## 2024-04-22 NOTE — Patient Instructions (Signed)
 You have been scheduled for an endoscopy and colonoscopy. Please follow the written instructions given to you at your visit today.  If you use inhalers (even only as needed), please bring them with you on the day of your procedure.  DO NOT TAKE 7 DAYS PRIOR TO TEST- Trulicity (dulaglutide) Ozempic, Wegovy (semaglutide) Mounjaro (tirzepatide) Bydureon Bcise (exanatide extended release)  DO NOT TAKE 1 DAY PRIOR TO YOUR TEST Rybelsus (semaglutide) Adlyxin (lixisenatide) Victoza (liraglutide) Byetta (exanatide) ___________________________________________________________________________ _______________________________________________________  If your blood pressure at your visit was 140/90 or greater, please contact your primary care physician to follow up on this.  _______________________________________________________  If you are age 70 or older, your body mass index should be between 23-30. Your Body mass index is 33.79 kg/m. If this is out of the aforementioned range listed, please consider follow up with your Primary Care Provider.  If you are age 23 or younger, your body mass index should be between 19-25. Your Body mass index is 33.79 kg/m. If this is out of the aformentioned range listed, please consider follow up with your Primary Care Provider.   ________________________________________________________  The [facility] providers would like to encourage you to use MYCHART to communicate with providers for non-urgent requests or questions.  Due to long hold times on the telephone, sending your provider a message by [facility] may be a faster and more efficient way to get a response.  Please allow 48 business hours for a response.  Please remember that this is for non-urgent requests.  _______________________________________________________  [facility] is using a team-based approach to care.  Your team is made up of your doctor and two to three APPS. Our APPS (Nurse  Practitioners and Physician Assistants) work with your physician to ensure care continuity for you. They are fully qualified to address your health concerns and develop a treatment plan. They communicate directly with your gastroenterologist to care for you. Seeing the Advanced Practice Practitioners on your physician's team can help you by facilitating care more promptly, often allowing for earlier appointments, access to diagnostic testing, procedures, and other specialty referrals.

## 2024-04-22 NOTE — Progress Notes (Signed)
 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  The patient is new to this practice.  No prior history of GI evaluations or GI procedures.  Patient tells me that she has had anemia over the past 10 to 15 years.  Review of recent blood work demonstrates iron  deficiency anemia.  Blood work in January 2025 demonstrated microcytic anemia with a hemoglobin of 8.0 and MCV 68.1.  She was iron  deficient with ferritin level of 4 and iron  saturation of 6%.  She was subsequently treated with a combination of iron  infusion therapy and oral iron  supplement.  Blood work from February 29, 2024 shows a ferritin level of 30 and iron  saturation of 14%.  MCV 81.1 and hemoglobin 10.1.  She denies melena or hematochezia.  She does take NSAIDs for menstrual cramping.  Her GI review of systems is entirely negative.  She does not donate blood.  She tells me that her menstrual periods have been heavy over the past 3 to 5 years.  She is known to have uterine fibroids.    REVIEW OF SYSTEMS:  All non-GI ROS negative. Past Medical History:  Diagnosis Date   Anemia    Takes iron  and folic acid   Anxiety    Diabetes mellitus without complication (HCC)    Pre- diabetic   GERD (gastroesophageal reflux disease)    no meds at present   Headache    Hypothyroidism    Insomnia    Piriformis syndrome    Situational depression    Vitamin B deficiency    Vitamin D  deficiency     Past Surgical History:  Procedure Laterality Date   BREAST BIOPSY Left 2018   fibroadenoma   LAPAROSCOPY  08/28/2011   Procedure: LAPAROSCOPY OPERATIVE;  Surgeon: [name], MD;  Location: [facility];  Service: Gynecology;  Laterality: Right;  Fugeration of Endometriosis, Removal of Left Paratubal Cyst./ Chromopertubation   TOTAL ABDOMINAL  HYSTERECTOMY      Social History Kimberly Atkinson  reports that she has never smoked. She has never used smokeless tobacco. She reports that she does not drink alcohol and does not use drugs.  family history includes Diabetes in her mother; Hyperlipidemia in her mother; Hypertension in her mother.  Allergies  Allergen Reactions   Topamax [Topiramate] Other (See Comments)    Strange dreams       PHYSICAL EXAMINATION: Vital signs: BP 122/80   Pulse 82   Ht 5' 7.5 (1.715 m)   Wt 219 lb (99.3 kg)   BMI 33.79 kg/m   Constitutional: generally well-appearing, no acute distress Psychiatric: alert and oriented x3, cooperative Eyes: extraocular movements intact, anicteric, conjunctiva pink Mouth: oral pharynx moist, no lesions Neck: supple no lymphadenopathy Cardiovascular: heart regular rate and rhythm, no murmur Lungs: clear to auscultation bilaterally Abdomen: soft, nontender, nondistended, no obvious ascites, no peritoneal signs, normal bowel sounds, no organomegaly Rectal: Deferred until colonoscopy Extremities: no clubbing, cyanosis, or lower extremity edema bilaterally Skin: no lesions on visible extremities Neuro: No focal deficits.  Cranial nerves intact  ASSESSMENT:  1.  Iron  deficiency anemia.  Rule out GI mucosal abnormality as etiology.  Alternatively, chronic menstrual blood loss is a likely candidate for iron  deficiency and anemia.   PLAN:  1.  Colonoscopy to evaluate iron  deficiency anemia.The nature of the procedure,  as well as the risks, benefits, and alternatives were carefully and thoroughly reviewed with the patient. Ample time for discussion and questions allowed. The patient understood, was satisfied, and agreed to proceed. 2.  Upper endoscopy to evaluate iron  deficiency anemia.The nature of the procedure, as well as the risks, benefits, and alternatives were carefully and thoroughly reviewed with the patient. Ample time for discussion and questions allowed.  The patient understood, was satisfied, and agreed to proceed. 3.  Hold oral iron  1 week prior to the procedures 4.  Ongoing monitoring and management of her iron  deficiency anemia per her hematologist. A total time of 45 minutes was spent preparing to see the patient, obtaining comprehensive history, performing medically appropriate physical examination, counseling and educating the patient regarding above listed issues, ordering multiple endoscopic procedures, and documenting clinical information in the health record

## 2024-05-31 ENCOUNTER — Ambulatory Visit: Payer: Self-pay | Admitting: Oncology

## 2024-05-31 ENCOUNTER — Inpatient Hospital Stay: Attending: Internal Medicine

## 2024-05-31 DIAGNOSIS — N920 Excessive and frequent menstruation with regular cycle: Secondary | ICD-10-CM | POA: Insufficient documentation

## 2024-05-31 DIAGNOSIS — D509 Iron deficiency anemia, unspecified: Secondary | ICD-10-CM | POA: Diagnosis present

## 2024-05-31 LAB — CBC (CANCER CENTER ONLY)
HCT: 27.7 % — ABNORMAL LOW (ref 36.0–46.0)
Hemoglobin: 8.5 g/dL — ABNORMAL LOW (ref 12.0–15.0)
MCH: 23.4 pg — ABNORMAL LOW (ref 26.0–34.0)
MCHC: 30.7 g/dL (ref 30.0–36.0)
MCV: 76.3 fL — ABNORMAL LOW (ref 80.0–100.0)
Platelet Count: 302 K/uL (ref 150–400)
RBC: 3.63 MIL/uL — ABNORMAL LOW (ref 3.87–5.11)
RDW: 14.6 % (ref 11.5–15.5)
WBC Count: 6.3 K/uL (ref 4.0–10.5)
nRBC: 0 % (ref 0.0–0.2)

## 2024-05-31 LAB — IRON AND TIBC
Iron: 15 ug/dL — ABNORMAL LOW (ref 28–170)
Saturation Ratios: 4 % — ABNORMAL LOW (ref 10.4–31.8)
TIBC: 389 ug/dL (ref 250–450)
UIBC: 374 ug/dL

## 2024-05-31 LAB — FERRITIN: Ferritin: 20 ng/mL (ref 11–307)

## 2024-06-02 ENCOUNTER — Other Ambulatory Visit: Payer: Self-pay | Admitting: Internal Medicine

## 2024-06-02 DIAGNOSIS — E890 Postprocedural hypothyroidism: Secondary | ICD-10-CM

## 2024-06-04 NOTE — Telephone Encounter (Signed)
 Requested Prescriptions  Pending Prescriptions Disp Refills   levothyroxine  (SYNTHROID ) 137 MCG tablet [Pharmacy Med Name: levothyroxine  137 mcg tablet] 90 tablet 0    Sig: Take 1 tablet (137 mcg total) by mouth daily.     Endocrinology:  Hypothyroid Agents Failed - 06/04/2024  8:50 AM      Failed - TSH in normal range and within 360 days    TSH  Date Value Ref Range Status  10/05/2023 71.800 (H) 0.450 - 4.500 uIU/mL Final         Passed - Valid encounter within last 12 months    Recent Outpatient Visits           8 months ago Annual physical exam   [facility] at [facility], Leita DEL, MD

## 2024-06-06 ENCOUNTER — Inpatient Hospital Stay

## 2024-06-06 VITALS — BP 125/82 | HR 61 | Temp 97.3°F | Resp 18

## 2024-06-06 DIAGNOSIS — D509 Iron deficiency anemia, unspecified: Secondary | ICD-10-CM | POA: Diagnosis not present

## 2024-06-06 MED ORDER — SODIUM CHLORIDE 0.9% FLUSH
10.0000 mL | Freq: Once | INTRAVENOUS | Status: AC | PRN
  Administered 2024-06-06: 10 mL
  Filled 2024-06-06: qty 10

## 2024-06-06 MED ORDER — IRON SUCROSE 20 MG/ML IV SOLN
200.0000 mg | INTRAVENOUS | Status: DC
  Administered 2024-06-06: 200 mg via INTRAVENOUS
  Filled 2024-06-06: qty 10

## 2024-06-10 ENCOUNTER — Inpatient Hospital Stay

## 2024-06-13 ENCOUNTER — Inpatient Hospital Stay

## 2024-06-13 VITALS — BP 110/70 | HR 63 | Temp 97.5°F | Resp 18

## 2024-06-13 DIAGNOSIS — D509 Iron deficiency anemia, unspecified: Secondary | ICD-10-CM

## 2024-06-13 MED ORDER — IRON SUCROSE 20 MG/ML IV SOLN
200.0000 mg | INTRAVENOUS | Status: DC
  Administered 2024-06-13: 200 mg via INTRAVENOUS
  Filled 2024-06-13: qty 10

## 2024-06-13 MED ORDER — SODIUM CHLORIDE 0.9% FLUSH
10.0000 mL | Freq: Once | INTRAVENOUS | Status: AC | PRN
  Administered 2024-06-13: 10 mL
  Filled 2024-06-13: qty 10

## 2024-06-20 ENCOUNTER — Telehealth: Payer: Self-pay | Admitting: Internal Medicine

## 2024-06-20 ENCOUNTER — Inpatient Hospital Stay: Attending: Internal Medicine

## 2024-06-20 VITALS — BP 137/79 | HR 72 | Temp 97.7°F | Resp 18

## 2024-06-20 DIAGNOSIS — D509 Iron deficiency anemia, unspecified: Secondary | ICD-10-CM | POA: Diagnosis present

## 2024-06-20 DIAGNOSIS — N920 Excessive and frequent menstruation with regular cycle: Secondary | ICD-10-CM | POA: Insufficient documentation

## 2024-06-20 MED ORDER — SODIUM CHLORIDE 0.9% FLUSH
10.0000 mL | Freq: Once | INTRAVENOUS | Status: AC | PRN
  Administered 2024-06-20: 10 mL
  Filled 2024-06-20: qty 10

## 2024-06-20 MED ORDER — IRON SUCROSE 20 MG/ML IV SOLN
200.0000 mg | INTRAVENOUS | Status: DC
  Administered 2024-06-20: 200 mg via INTRAVENOUS
  Filled 2024-06-20: qty 10

## 2024-06-20 NOTE — Telephone Encounter (Signed)
 Good afternoon Dr. Abran,   I received a call from this patient stating that she is needing to reschedule her procedure set for December the 05 th. Patient stated that she is having a heavy cycle and would like to not be on her cycle for her procedure. Patient was reschedule for January the 23 rd. Please advise.  Thank you

## 2024-06-21 ENCOUNTER — Inpatient Hospital Stay

## 2024-06-21 NOTE — Telephone Encounter (Signed)
Change noted.

## 2024-06-24 ENCOUNTER — Encounter: Admitting: Internal Medicine

## 2024-06-27 ENCOUNTER — Inpatient Hospital Stay

## 2024-07-04 ENCOUNTER — Inpatient Hospital Stay

## 2024-07-04 VITALS — BP 124/62 | HR 64 | Temp 99.0°F

## 2024-07-04 DIAGNOSIS — D509 Iron deficiency anemia, unspecified: Secondary | ICD-10-CM | POA: Diagnosis not present

## 2024-07-04 MED ORDER — IRON SUCROSE 20 MG/ML IV SOLN
200.0000 mg | INTRAVENOUS | Status: DC
  Administered 2024-07-04: 15:00:00 200 mg via INTRAVENOUS
  Filled 2024-07-04: qty 10

## 2024-07-04 NOTE — Patient Instructions (Signed)

## 2024-07-11 ENCOUNTER — Inpatient Hospital Stay

## 2024-07-11 ENCOUNTER — Other Ambulatory Visit: Payer: Self-pay | Admitting: Oncology

## 2024-07-11 VITALS — BP 123/93 | HR 66 | Temp 100.0°F | Resp 16

## 2024-07-11 DIAGNOSIS — D509 Iron deficiency anemia, unspecified: Secondary | ICD-10-CM | POA: Diagnosis not present

## 2024-07-11 MED ORDER — IRON SUCROSE 20 MG/ML IV SOLN
200.0000 mg | INTRAVENOUS | Status: DC
  Administered 2024-07-11: 200 mg via INTRAVENOUS
  Filled 2024-07-11: qty 10

## 2024-07-11 NOTE — Patient Instructions (Signed)

## 2024-07-29 ENCOUNTER — Ambulatory Visit

## 2024-07-29 VITALS — Ht 67.5 in | Wt 220.2 lb

## 2024-07-29 DIAGNOSIS — D509 Iron deficiency anemia, unspecified: Secondary | ICD-10-CM

## 2024-07-29 NOTE — Progress Notes (Signed)
 No egg or soy allergy known to patient  No issues known to pt with past sedation with any surgeries or procedures Patient denies ever being told they had issues or difficulty with intubation  No FH of Malignant Hyperthermia Pt is not on diet pills Pt is not on  home 02  Pt is not on blood thinners  Pt denies issues with constipation  No A fib or A flutter Have any cardiac testing pending--No Pt can ambulate  Pt denies use of chewing tobacco Discussed diabetic I weight loss medication holds Discussed NSAID holds Checked BMI Pt instructed to use Singlecare.com or GoodRx for a price reduction on prep  Patient's chart reviewed by Norleen Schillings CNRA prior to previsit and patient appropriate for the [facility].  Pre visit completed and red dot placed by patient's name on their procedure day (on provider's schedule).    Patient states she already has Suprep, unopened, at home   Patient requested Endocolon be rescheduled to 1st week of February d/t conflict; r/s from 1/23 to 08/24/24 @ 0700

## 2024-08-12 ENCOUNTER — Encounter: Admitting: Internal Medicine

## 2024-08-15 ENCOUNTER — Encounter: Payer: Self-pay | Admitting: Internal Medicine

## 2024-08-24 ENCOUNTER — Encounter: Payer: Self-pay | Admitting: Internal Medicine

## 2024-08-24 ENCOUNTER — Ambulatory Visit: Admitting: Internal Medicine

## 2024-08-24 VITALS — BP 132/81 | HR 67 | Temp 98.4°F | Resp 17 | Ht 67.0 in | Wt 220.2 lb

## 2024-08-24 DIAGNOSIS — K449 Diaphragmatic hernia without obstruction or gangrene: Secondary | ICD-10-CM | POA: Diagnosis not present

## 2024-08-24 DIAGNOSIS — Z1211 Encounter for screening for malignant neoplasm of colon: Secondary | ICD-10-CM

## 2024-08-24 DIAGNOSIS — K222 Esophageal obstruction: Secondary | ICD-10-CM

## 2024-08-24 DIAGNOSIS — D509 Iron deficiency anemia, unspecified: Secondary | ICD-10-CM | POA: Diagnosis not present

## 2024-08-24 DIAGNOSIS — K319 Disease of stomach and duodenum, unspecified: Secondary | ICD-10-CM

## 2024-08-24 MED ORDER — SODIUM CHLORIDE 0.9 % IV SOLN: 500.0000 mL | INTRAVENOUS | Status: DC

## 2024-08-24 NOTE — Progress Notes (Signed)
 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  The patient is new to this practice.  No prior history of GI evaluations or GI procedures.   Patient tells me that she has had anemia over the past 10 to 15 years.  Review of recent blood work demonstrates iron  deficiency anemia.  Blood work in January 2025 demonstrated microcytic anemia with a hemoglobin of 8.0 and MCV 68.1.  She was iron  deficient with ferritin level of 4 and iron  saturation of 6%.  She was subsequently treated with a combination of iron  infusion therapy and oral iron  supplement.  Blood work from February 29, 2024 shows a ferritin level of 30 and iron  saturation of 14%.  MCV 81.1 and hemoglobin 10.1.   She denies melena or hematochezia.  She does take NSAIDs for menstrual cramping.  Her GI review of systems is entirely negative.  She does not donate blood.  She tells me that her menstrual periods have been heavy over the past 3 to 5 years.  She is known to have uterine fibroids.       REVIEW OF SYSTEMS:   All non-GI ROS negative.     Past Medical History:  Diagnosis Date   Anemia      Takes iron  and folic acid   Anxiety     Diabetes mellitus without complication (HCC)      Pre- diabetic   GERD (gastroesophageal reflux disease)      no meds at present   Headache     Hypothyroidism     Insomnia     Piriformis syndrome     Situational depression     Vitamin B deficiency     Vitamin D  deficiency                 Past Surgical History:  Procedure Laterality Date   BREAST BIOPSY Left 2018    fibroadenoma   LAPAROSCOPY   08/28/2011    Procedure: LAPAROSCOPY OPERATIVE;  Surgeon: [name], MD;  Location: [facility];  Service: Gynecology;  Laterality: Right;  Fugeration of Endometriosis,  Removal of Left Paratubal Cyst./ Chromopertubation   TOTAL ABDOMINAL HYSTERECTOMY              Social History Kimberly Atkinson  reports that she has never smoked. She has never used smokeless tobacco. She reports that she does not drink alcohol and does not use drugs.   family history includes Diabetes in her mother; Hyperlipidemia in her mother; Hypertension in her mother.   Allergies       Allergies  Allergen Reactions   Topamax [Topiramate] Other (See Comments)      Strange dreams            PHYSICAL EXAMINATION: Vital signs: BP 122/80   Pulse 82   Ht 5' 7.5 (1.715 m)   Wt 219 lb (99.3 kg)   BMI 33.79 kg/m   Constitutional: generally well-appearing, no acute distress Psychiatric: alert and oriented x3, cooperative Eyes: extraocular movements intact, anicteric, conjunctiva pink Mouth: oral pharynx moist, no lesions Neck: supple no lymphadenopathy Cardiovascular: heart regular rate and rhythm, no murmur Lungs: clear to auscultation bilaterally Abdomen: soft, nontender, nondistended, no obvious ascites, no peritoneal signs, normal bowel sounds, no organomegaly Rectal: Deferred until colonoscopy Extremities: no clubbing,  cyanosis, or lower extremity edema bilaterally Skin: no lesions on visible extremities Neuro: No focal deficits.  Cranial nerves intact   ASSESSMENT:   1.  Iron  deficiency anemia.  Rule out GI mucosal abnormality as etiology.  Alternatively, chronic menstrual blood loss is a likely candidate for iron  deficiency and anemia.     PLAN:   1.  Colonoscopy to evaluate iron  deficiency anemia.The nature of the procedure, as well as the risks, benefits, and alternatives were carefully and thoroughly reviewed with the patient. Ample time for discussion and questions allowed. The patient understood, was satisfied, and agreed to proceed. 2.  Upper endoscopy to evaluate iron  deficiency anemia.The nature of the procedure, as well as the risks, benefits, and  alternatives were carefully and thoroughly reviewed with the patient. Ample time for discussion and questions allowed. The patient understood, was satisfied, and agreed to proceed. 3.  Hold oral iron  1 week prior to the procedures 4.  Ongoing monitoring and management of her iron  deficiency anemia per her hematologist.     Most recent office H&P as above.  No interval change.  Last hemoglobin in January was 9.1.  Now for colonoscopy and upper endoscopy

## 2024-08-24 NOTE — Progress Notes (Signed)
 Called to room to assist during endoscopic procedure.  Patient ID and intended procedure confirmed with present staff. Received instructions for my participation in the procedure from the performing physician.

## 2024-08-24 NOTE — Op Note (Signed)
 [facility] Patient Name: Kimberly Atkinson Procedure Date: 08/24/2024 7:29 AM MRN: [id number] Endoscopist: Norleen SAILOR. Abran , MD, [id number] Age: 49 Referring MD:  Date of Birth: Mar 22, 1976 Gender: Female Account #: [id number] Procedure:                Upper GI endoscopy with biopsies Indications:              Iron  deficiency anemia Medicines:                Monitored Anesthesia Care Procedure:                Pre-Anesthesia Assessment:                           - Prior to the procedure, a History and Physical                            was performed, and patient medications and                            allergies were reviewed. The patient's tolerance of                            previous anesthesia was also reviewed. The risks                            and benefits of the procedure and the sedation                            options and risks were discussed with the patient.                            All questions were answered, and informed consent                            was obtained. Prior Anticoagulants: The patient has                            taken no anticoagulant or antiplatelet agents. ASA                            Grade Assessment: II - A patient with mild systemic                            disease. After reviewing the risks and benefits,                            the patient was deemed in satisfactory condition to                            undergo the procedure.                           After obtaining informed consent, the endoscope was  passed under direct vision. Throughout the                            procedure, the patient's blood pressure, pulse, and                            oxygen saturations were monitored continuously. The                            GIF [id number] #[id number] was introduced through the                            mouth, and advanced to the second part of duodenum.                            The upper GI  endoscopy was accomplished without                            difficulty. The patient tolerated the procedure                            well. Scope In: Scope Out: Findings:                 The esophagus a distal ringlike esophageal                            stricture measuring approximately 16 mm. No                            inflammation or other abnormality.                           The stomach was normal, save a small hiatal hernia.                           The examined duodenum was normal. Biopsies were                            taken with a cold forceps for histology.                           The cardia and gastric fundus were normal on                            retroflexion. Complications:            No immediate complications. Estimated Blood Loss:     Estimated blood loss: none. Impression:               1. Incidental esophageal stricture. Otherwise                            unremarkable EGD                           2. Status post duodenal biopsies  to rule out sprue                            to the patient with IDA                           3. Iron  deficiency anemia felt secondary to chronic                            heavy menstrual blood loss. Recommendation:           - Patient has a contact number available for                            emergencies. The signs and symptoms of potential                            delayed complications were discussed with the                            patient. Return to normal activities tomorrow.                            Written discharge instructions were provided to the                            patient.                           - Resume previous diet.                           - Continue present medications.                           - Await pathology results.                           - Return to the care of your primary providers Norleen SAILOR. Abran, MD 08/24/2024 9:31:47 AM This report has been signed electronically.

## 2024-08-24 NOTE — Progress Notes (Signed)
 Transferred to PACU via stretcher.  Not responding to stimulation at this time.  VSS upon leaving procedure room.

## 2024-08-24 NOTE — Patient Instructions (Addendum)
 Patient has a contact number available for                            emergencies. The signs and symptoms of potential                            delayed complications were discussed with the                            patient. Return to normal activities tomorrow.                            Written discharge instructions were provided to the                            patient.                           - Resume previous diet.                           - Continue present medications.                           - Await pathology results.                           - Return to the care of your primary providers Resume previous diet.                           - Continue present medications.                           - Await pathology results.                           - EGD today. Please see report      YOU HAD AN ENDOSCOPIC PROCEDURE TODAY AT THE [facility]:   Refer to the procedure report that was given to you for any specific questions about what was found during the examination.  If the procedure report does not answer your questions, please call your gastroenterologist to clarify.  If you requested that your care partner not be given the details of your procedure findings, then the procedure report has been included in a sealed envelope for you to review at your convenience later.  YOU SHOULD EXPECT: Some feelings of bloating in the abdomen. Passage of more gas than usual.  Walking can help get rid of the air that was put into your GI tract during the procedure and reduce the bloating. If you had a lower endoscopy (such as a colonoscopy or flexible sigmoidoscopy) you may notice spotting of blood in your stool or on the toilet paper. If you underwent a bowel prep for your procedure, you may not have a normal bowel movement for a few days.  Please Note:  You might notice some irritation and congestion in your nose or some drainage.  This is from the oxygen used during your  procedure.  There is no  need for concern and it should clear up in a day or so.  SYMPTOMS TO REPORT IMMEDIATELY:  Following lower endoscopy (colonoscopy or flexible sigmoidoscopy):  Excessive amounts of blood in the stool  Significant tenderness or worsening of abdominal pains  Swelling of the abdomen that is new, acute  Fever of 100F or higher  Following upper endoscopy (EGD)  Vomiting of blood or coffee ground material  New chest pain or pain under the shoulder blades  Painful or persistently difficult swallowing  New shortness of breath  Fever of 100F or higher  Black, tarry-looking stools  For urgent or emergent issues, a gastroenterologist can be reached at any hour by calling [phone]. Do not use MyChart messaging for urgent concerns.    DIET:  We do recommend a small meal at first, but then you may proceed to your regular diet.  Drink plenty of fluids but you should avoid alcoholic beverages for 24 hours.  ACTIVITY:  You should plan to take it easy for the rest of today and you should NOT DRIVE or use heavy machinery until tomorrow (because of the sedation medicines used during the test).    FOLLOW UP: Our staff will call the number listed on your records the next business day following your procedure.  We will call around 7:15- 8:00 am to check on you and address any questions or concerns that you may have regarding the information given to you following your procedure. If we do not reach you, we will leave a message.     If any biopsies were taken you will be contacted by phone or by letter within the next 1-3 weeks.  Please call us  at [phone] if you have not heard about the biopsies in 3 weeks.    SIGNATURES/CONFIDENTIALITY: You and/or your care partner have signed paperwork which will be entered into your electronic medical record.  These signatures attest to the fact that that the information above on your After Visit Summary has been reviewed and is  understood.  Full responsibility of the confidentiality of this discharge information lies with you and/or your care-partner.

## 2024-08-24 NOTE — Op Note (Signed)
 [facility] Patient Name: Kimberly Atkinson Procedure Date: 08/24/2024 7:31 AM MRN: [id number] Endoscopist: Norleen SAILOR. Abran , MD, [id number] Age: 49 Referring MD:  Date of Birth: 11/07/1975 Gender: Female Account #: [id number] Procedure:                Colonoscopy Indications:              Iron  deficiency anemia, colon cancer screening.                            Index exam Medicines:                Monitored Anesthesia Care Procedure:                Pre-Anesthesia Assessment:                           - Prior to the procedure, a History and Physical                            was performed, and patient medications and                            allergies were reviewed. The patient's tolerance of                            previous anesthesia was also reviewed. The risks                            and benefits of the procedure and the sedation                            options and risks were discussed with the patient.                            All questions were answered, and informed consent                            was obtained. Prior Anticoagulants: The patient has                            taken no anticoagulant or antiplatelet agents. ASA                            Grade Assessment: II - A patient with mild systemic                            disease. After reviewing the risks and benefits,                            the patient was deemed in satisfactory condition to                            undergo the procedure.  After obtaining informed consent, the colonoscope                            was passed under direct vision. Throughout the                            procedure, the patient's blood pressure, pulse, and                            oxygen saturations were monitored continuously. The                            CF [id number] #[id number] was introduced through the anus                            and advanced to the the cecum, identified by                             appendiceal orifice and ileocecal valve. The                            ileocecal valve, appendiceal orifice, and rectum                            were photographed. The quality of the bowel                            preparation was excellent. The colonoscopy was                            performed without difficulty. The patient tolerated                            the procedure well. The bowel preparation used was                            SUPREP via split dose instruction. Scope In: 8:51:45 AM Scope Out: 9:08:53 AM Scope Withdrawal Time: 0 hours 11 minutes 41 seconds  Total Procedure Duration: 0 hours 17 minutes 8 seconds  Findings:                 The entire examined colon appeared normal on direct                            and retroflexion views. Complications:            No immediate complications. Estimated blood loss:                            None. Estimated Blood Loss:     Estimated blood loss: none. Impression:               - The entire examined colon is normal on direct and  retroflexion views.                           - No specimens collected. Recommendation:           - Repeat colonoscopy in 10 years for screening                            purposes.                           - Patient has a contact number available for                            emergencies. The signs and symptoms of potential                            delayed complications were discussed with the                            patient. Return to normal activities tomorrow.                            Written discharge instructions were provided to the                            patient.                           - Resume previous diet.                           - Continue present medications.                           - Await pathology results.                           - EGD today. Please see report Norleen SAILOR. Abran, MD 08/24/2024 9:24:59 AM This  report has been signed electronically.

## 2024-08-25 ENCOUNTER — Telehealth: Payer: Self-pay | Admitting: *Deleted

## 2024-08-25 NOTE — Telephone Encounter (Signed)
 Attempted f/u phone call. No answer. Left message.

## 2024-08-26 LAB — SURGICAL PATHOLOGY

## 2024-08-31 ENCOUNTER — Other Ambulatory Visit

## 2024-08-31 ENCOUNTER — Ambulatory Visit: Admitting: Oncology
